# Patient Record
Sex: Female | Born: 1970
Health system: Southern US, Community
[De-identification: ages and names within clinical notes are randomized; demographics above are authoritative.]

## PROBLEM LIST (undated history)

## (undated) DIAGNOSIS — R002 Palpitations: Secondary | ICD-10-CM

## (undated) DIAGNOSIS — J4520 Mild intermittent asthma, uncomplicated: Secondary | ICD-10-CM

## (undated) DIAGNOSIS — K219 Gastro-esophageal reflux disease without esophagitis: Secondary | ICD-10-CM

## (undated) DIAGNOSIS — M47812 Spondylosis without myelopathy or radiculopathy, cervical region: Secondary | ICD-10-CM

## (undated) DIAGNOSIS — R0602 Shortness of breath: Secondary | ICD-10-CM

## (undated) DIAGNOSIS — R1013 Epigastric pain: Secondary | ICD-10-CM

## (undated) DIAGNOSIS — G8929 Other chronic pain: Secondary | ICD-10-CM

## (undated) DIAGNOSIS — M5136 Other intervertebral disc degeneration, lumbar region: Secondary | ICD-10-CM

## (undated) DIAGNOSIS — J45909 Unspecified asthma, uncomplicated: Secondary | ICD-10-CM

## (undated) DIAGNOSIS — D649 Anemia, unspecified: Secondary | ICD-10-CM

## (undated) DIAGNOSIS — I4891 Unspecified atrial fibrillation: Secondary | ICD-10-CM

## (undated) DIAGNOSIS — Z87442 Personal history of urinary calculi: Secondary | ICD-10-CM

## (undated) DIAGNOSIS — I209 Angina pectoris, unspecified: Secondary | ICD-10-CM

## (undated) DIAGNOSIS — J309 Allergic rhinitis, unspecified: Secondary | ICD-10-CM

## (undated) DIAGNOSIS — M51369 Other intervertebral disc degeneration, lumbar region without mention of lumbar back pain or lower extremity pain: Secondary | ICD-10-CM

## (undated) DIAGNOSIS — K635 Polyp of colon: Secondary | ICD-10-CM

## (undated) HISTORY — DX: Unspecified asthma, uncomplicated: J45.909

## (undated) HISTORY — DX: Anemia, unspecified: D64.9

## (undated) HISTORY — PX: OTHER SURGICAL HISTORY: SHX169

## (undated) HISTORY — DX: Mild intermittent asthma, uncomplicated: J45.20

## (undated) HISTORY — DX: Unspecified atrial fibrillation: I48.91

## (undated) HISTORY — DX: Polyp of colon: K63.5

## (undated) HISTORY — DX: Gastro-esophageal reflux disease without esophagitis: K21.9

## (undated) HISTORY — DX: Personal history of urinary calculi: Z87.442

## (undated) HISTORY — DX: Palpitations: R00.2

## (undated) HISTORY — DX: Shortness of breath: R06.02

## (undated) HISTORY — PX: DIAGNOSTIC LAPAROSCOPY: SUR761

## (undated) HISTORY — PX: KIDNEY STONE SURGERY: SHX686

## (undated) HISTORY — DX: Epigastric pain: R10.13

## (undated) HISTORY — PX: UPPER GI ENDOSCOPY: SHX6162

---

## 2004-04-17 ENCOUNTER — Ambulatory Visit: Payer: Self-pay | Admitting: Obstetrics and Gynecology

## 2004-07-24 ENCOUNTER — Inpatient Hospital Stay: Payer: Self-pay | Admitting: Obstetrics and Gynecology

## 2004-10-24 ENCOUNTER — Ambulatory Visit: Payer: Self-pay | Admitting: Internal Medicine

## 2005-01-19 ENCOUNTER — Ambulatory Visit: Payer: Self-pay | Admitting: Obstetrics and Gynecology

## 2005-02-05 HISTORY — PX: ABDOMINAL HYSTERECTOMY: SHX81

## 2005-08-21 ENCOUNTER — Ambulatory Visit: Payer: Self-pay | Admitting: Gastroenterology

## 2006-05-09 ENCOUNTER — Ambulatory Visit: Payer: Self-pay | Admitting: Obstetrics and Gynecology

## 2007-05-08 ENCOUNTER — Emergency Department: Payer: Self-pay | Admitting: Emergency Medicine

## 2007-05-08 ENCOUNTER — Other Ambulatory Visit: Payer: Self-pay

## 2007-12-08 ENCOUNTER — Emergency Department: Payer: Self-pay | Admitting: Emergency Medicine

## 2007-12-10 ENCOUNTER — Ambulatory Visit: Payer: Self-pay | Admitting: Family Medicine

## 2008-07-10 ENCOUNTER — Ambulatory Visit: Payer: Self-pay | Admitting: Internal Medicine

## 2008-08-19 ENCOUNTER — Ambulatory Visit: Payer: Self-pay | Admitting: Unknown Physician Specialty

## 2009-12-26 ENCOUNTER — Ambulatory Visit: Payer: Self-pay | Admitting: Internal Medicine

## 2010-04-30 ENCOUNTER — Emergency Department: Payer: Self-pay | Admitting: Internal Medicine

## 2010-05-19 ENCOUNTER — Ambulatory Visit: Payer: Self-pay | Admitting: Family Medicine

## 2011-08-01 ENCOUNTER — Ambulatory Visit: Payer: Self-pay | Admitting: Obstetrics and Gynecology

## 2012-02-06 DIAGNOSIS — K635 Polyp of colon: Secondary | ICD-10-CM

## 2012-02-06 HISTORY — PX: COLONOSCOPY: SHX174

## 2012-02-06 HISTORY — DX: Polyp of colon: K63.5

## 2012-02-08 ENCOUNTER — Ambulatory Visit: Payer: Self-pay | Admitting: Otolaryngology

## 2012-03-08 ENCOUNTER — Emergency Department: Payer: Self-pay | Admitting: Emergency Medicine

## 2012-03-08 LAB — CBC
HCT: 39 % (ref 35.0–47.0)
HGB: 12.8 g/dL (ref 12.0–16.0)
MCH: 29.6 pg (ref 26.0–34.0)
MCHC: 32.9 g/dL (ref 32.0–36.0)
Platelet: 231 10*3/uL (ref 150–440)
RDW: 12.8 % (ref 11.5–14.5)
WBC: 6.6 10*3/uL (ref 3.6–11.0)

## 2012-03-08 LAB — BASIC METABOLIC PANEL
BUN: 17 mg/dL (ref 7–18)
Calcium, Total: 9 mg/dL (ref 8.5–10.1)
Chloride: 107 mmol/L (ref 98–107)
EGFR (Non-African Amer.): >60
Potassium: 3.8 mmol/L (ref 3.5–5.1)
Sodium: 141 mmol/L (ref 136–145)

## 2012-03-09 LAB — RAPID INFLUENZA A&B ANTIGENS

## 2012-08-01 ENCOUNTER — Ambulatory Visit: Payer: Self-pay | Admitting: Obstetrics and Gynecology

## 2013-04-28 DIAGNOSIS — I209 Angina pectoris, unspecified: Secondary | ICD-10-CM | POA: Insufficient documentation

## 2013-08-04 ENCOUNTER — Ambulatory Visit: Payer: Self-pay | Admitting: Obstetrics and Gynecology

## 2013-08-05 ENCOUNTER — Ambulatory Visit: Payer: Self-pay | Admitting: Obstetrics and Gynecology

## 2014-02-05 DIAGNOSIS — I4891 Unspecified atrial fibrillation: Secondary | ICD-10-CM

## 2014-02-05 HISTORY — DX: Unspecified atrial fibrillation: I48.91

## 2014-02-22 ENCOUNTER — Emergency Department: Payer: Self-pay | Admitting: Emergency Medicine

## 2014-02-22 LAB — CBC
HCT: 39.5 % (ref 35.0–47.0)
HGB: 13 g/dL (ref 12.0–16.0)
MCH: 30.1 pg (ref 26.0–34.0)
MCHC: 33 g/dL (ref 32.0–36.0)
MCV: 91 fL (ref 80–100)
Platelet: 308 10*3/uL (ref 150–440)
RBC: 4.34 10*6/uL (ref 3.80–5.20)
RDW: 12.7 % (ref 11.5–14.5)
WBC: 8.6 10*3/uL (ref 3.6–11.0)

## 2014-02-22 LAB — BASIC METABOLIC PANEL
Anion Gap: 7 (ref 7–16)
BUN: 17 mg/dL (ref 7–18)
CALCIUM: 8.4 mg/dL — AB (ref 8.5–10.1)
CHLORIDE: 108 mmol/L — AB (ref 98–107)
CO2: 27 mmol/L (ref 21–32)
CREATININE: 0.71 mg/dL (ref 0.60–1.30)
EGFR (Non-African Amer.): >60
Glucose: 113 mg/dL — ABNORMAL HIGH (ref 65–99)
Osmolality: 285 (ref 275–301)
POTASSIUM: 3.7 mmol/L (ref 3.5–5.1)
Sodium: 142 mmol/L (ref 136–145)

## 2014-02-22 LAB — TROPONIN I: Troponin-I: <0.02 ng/mL

## 2014-02-23 LAB — D-DIMER(ARMC): D-Dimer: 274 ng/ml

## 2014-02-23 LAB — TROPONIN I: Troponin-I: <0.02 ng/mL

## 2014-02-24 ENCOUNTER — Inpatient Hospital Stay: Payer: Self-pay | Admitting: Internal Medicine

## 2014-02-24 LAB — CBC
HCT: 42.8 % (ref 35.0–47.0)
HGB: 13.6 g/dL (ref 12.0–16.0)
MCH: 29.1 pg (ref 26.0–34.0)
MCHC: 31.8 g/dL — AB (ref 32.0–36.0)
MCV: 92 fL (ref 80–100)
Platelet: 315 10*3/uL (ref 150–440)
RBC: 4.67 10*6/uL (ref 3.80–5.20)
RDW: 12.5 % (ref 11.5–14.5)
WBC: 13.9 10*3/uL — ABNORMAL HIGH (ref 3.6–11.0)

## 2014-02-24 LAB — BASIC METABOLIC PANEL
ANION GAP: 9 (ref 7–16)
BUN: 19 mg/dL — ABNORMAL HIGH (ref 7–18)
CHLORIDE: 105 mmol/L (ref 98–107)
CREATININE: 0.77 mg/dL (ref 0.60–1.30)
Calcium, Total: 8.9 mg/dL (ref 8.5–10.1)
Co2: 26 mmol/L (ref 21–32)
EGFR (African American): >60
EGFR (Non-African Amer.): >60
Glucose: 134 mg/dL — ABNORMAL HIGH (ref 65–99)
OSMOLALITY: 284 (ref 275–301)
POTASSIUM: 3.8 mmol/L (ref 3.5–5.1)
Sodium: 140 mmol/L (ref 136–145)

## 2014-02-24 LAB — URINALYSIS, COMPLETE
BACTERIA: NONE SEEN
BLOOD: NEGATIVE
Bilirubin,UR: NEGATIVE
GLUCOSE, UR: NEGATIVE mg/dL (ref 0–75)
Ketone: NEGATIVE
Leukocyte Esterase: NEGATIVE
Nitrite: NEGATIVE
Ph: 7 (ref 4.5–8.0)
Protein: NEGATIVE
RBC,UR: NONE SEEN /HPF (ref 0–5)
SPECIFIC GRAVITY: 1.002 (ref 1.003–1.030)
Squamous Epithelial: 1
WBC UR: NONE SEEN /HPF (ref 0–5)

## 2014-02-24 LAB — MAGNESIUM: Magnesium: 2 mg/dL

## 2014-02-24 LAB — TROPONIN I: Troponin-I: <0.02 ng/mL

## 2014-02-24 LAB — TSH: THYROID STIMULATING HORM: 4.59 u[IU]/mL — AB

## 2014-02-25 ENCOUNTER — Other Ambulatory Visit: Payer: Self-pay | Admitting: Physician Assistant

## 2014-02-25 ENCOUNTER — Telehealth: Payer: Self-pay

## 2014-02-25 DIAGNOSIS — I4891 Unspecified atrial fibrillation: Secondary | ICD-10-CM

## 2014-02-25 DIAGNOSIS — E876 Hypokalemia: Secondary | ICD-10-CM

## 2014-02-25 LAB — TROPONIN I: Troponin-I: <0.02 ng/mL

## 2014-02-25 LAB — CBC WITH DIFFERENTIAL/PLATELET
BASOS PCT: 1.3 %
Basophil #: 0.1 10*3/uL (ref 0.0–0.1)
EOS ABS: 0.1 10*3/uL (ref 0.0–0.7)
EOS PCT: 0.8 %
HCT: 39 % (ref 35.0–47.0)
HGB: 12.7 g/dL (ref 12.0–16.0)
LYMPHS PCT: 17.8 %
Lymphocyte #: 1.7 10*3/uL (ref 1.0–3.6)
MCH: 29.9 pg (ref 26.0–34.0)
MCHC: 32.7 g/dL (ref 32.0–36.0)
MCV: 92 fL (ref 80–100)
Monocyte #: 0.9 x10 3/mm (ref 0.2–0.9)
Monocyte %: 9.7 %
Neutrophil #: 6.6 10*3/uL — ABNORMAL HIGH (ref 1.4–6.5)
Neutrophil %: 70.4 %
Platelet: 289 10*3/uL (ref 150–440)
RBC: 4.25 10*6/uL (ref 3.80–5.20)
RDW: 12.5 % (ref 11.5–14.5)
WBC: 9.4 10*3/uL (ref 3.6–11.0)

## 2014-02-25 LAB — LIPID PANEL
Cholesterol: 185 mg/dL (ref 0–200)
HDL: 62 mg/dL — AB (ref 40–60)
LDL CHOLESTEROL, CALC: 111 mg/dL — AB (ref 0–100)
Triglycerides: 60 mg/dL (ref 0–200)
VLDL CHOLESTEROL, CALC: 12 mg/dL (ref 5–40)

## 2014-02-25 LAB — MAGNESIUM: Magnesium: 2.1 mg/dL

## 2014-02-25 LAB — T4, FREE: FREE THYROXINE: 1.15 ng/dL (ref 0.76–1.46)

## 2014-02-25 NOTE — Telephone Encounter (Signed)
Attempted to contact pt regarding discharge from [facility] on 02/25/14. Left message asking pt to call back w/ any questions or concerns about meds, discharge instructions or if she will be unable to keep appt w/ Dr. Mariah MillingGollan 03/08/14 @ 7:45.

## 2014-02-26 ENCOUNTER — Telehealth: Payer: Self-pay | Admitting: Internal Medicine

## 2014-02-26 NOTE — Telephone Encounter (Signed)
Patient called complaining of similar symptoms that she had when she was admitted to [facility] with A. fib. She reported left-sided chest discomfort, nausea, lightheadedness, not feeling well overall. Reported that her heart rate is in high 80s according to her estimations. I Recommended her to go to the nearest emergency department for further evaluation

## 2014-03-01 NOTE — Telephone Encounter (Signed)
Would call to see if we can help Could do EKG in the office/nurse visit,  even office visit if he is available

## 2014-03-02 NOTE — Telephone Encounter (Signed)
Spoke w/ pt.  She reports that she has been feeling fine, other than some GI issues, but no further cardiac concerns.  Moved pt's appt sooner to 03/04/14 @ 7:45. She prefers this time, as she can come in after dropping her kids off at school.

## 2014-03-04 ENCOUNTER — Encounter: Payer: Self-pay | Admitting: Cardiovascular Disease

## 2014-03-04 ENCOUNTER — Ambulatory Visit (INDEPENDENT_AMBULATORY_CARE_PROVIDER_SITE_OTHER): Payer: BLUE CROSS/BLUE SHIELD | Admitting: Cardiovascular Disease

## 2014-03-04 VITALS — BP 108/62 | HR 80 | Ht 67.0 in | Wt 152.2 lb

## 2014-03-04 DIAGNOSIS — J4520 Mild intermittent asthma, uncomplicated: Secondary | ICD-10-CM

## 2014-03-04 DIAGNOSIS — R002 Palpitations: Secondary | ICD-10-CM

## 2014-03-04 DIAGNOSIS — I4891 Unspecified atrial fibrillation: Secondary | ICD-10-CM

## 2014-03-04 DIAGNOSIS — R0602 Shortness of breath: Secondary | ICD-10-CM

## 2014-03-04 DIAGNOSIS — R1013 Epigastric pain: Secondary | ICD-10-CM

## 2014-03-04 HISTORY — DX: Mild intermittent asthma, uncomplicated: J45.20

## 2014-03-04 HISTORY — DX: Shortness of breath: R06.02

## 2014-03-04 NOTE — Patient Instructions (Signed)
You are doing well. No medication changes were made.  Consider nexium for acid reflux Track your rhythm using your phone, blood pressure cuff  Call if the shortness of breath gets worse  Please call us if you have new issues that need to be addressed before your next appt.  Your physician wants you to follow-up in: 6 months.  You will receive a reminder letter in the mail two months in advance. If you don't receive a letter, please call our office to schedule the follow-up appointment.

## 2014-03-04 NOTE — Assessment & Plan Note (Signed)
Rare palpitations. Likely unrelated to recent episode of atrial fibrillation. We have suggested if symptoms get worse, that she call our office for a 2 day monitor or 30 day monitor depending on the frequency. She can take diltiazem for any concern of arrhythmia

## 2014-03-04 NOTE — Assessment & Plan Note (Signed)
Etiology of her abdominal pain is unclear. Center on the left upper flank. Unable to exclude musculoskeletal she has EGD pending

## 2014-03-04 NOTE — Assessment & Plan Note (Signed)
Etiology of her shortness of breath is unclear. No clear signs of heart failure on today's visit. No recent arrhythmia. Symptoms of shortness of breath started weeks ago prior to any atrial fibrillation. Low likelihood of ischemia given her age and no risk factors. Suspect this could be from asthma. If symptoms get worse, suggested she call our office.

## 2014-03-04 NOTE — Assessment & Plan Note (Signed)
Suggested she continue to take her inhaler as needed for asthma

## 2014-03-04 NOTE — Assessment & Plan Note (Signed)
Maintaining normal sinus rhythm. Suggested she take diltiazem when necessary. No limitations on her exercise. Low risk for upcoming EGD

## 2014-03-04 NOTE — Progress Notes (Signed)
Patient ID: Kayla Castro, female    DOB: 08/07/1970, 44 y.o.   MRN: [id number]  HPI Comments: Kayla Castro  Is a pleasant 44 year old woman with history of asthma, anemia, [facility] to [facility]14 with abdominal pain, diarrhea, EKG showing atrial fibrillation with heart rate 160 bpm, also with self-reported shortness of breath for several weeks prior to admission, potassium 3.7 on arrival, negative cardiac enzymes, essentially normal TSH, echocardiogram showing normal ejection fraction, normal right ventricular systolic pressure to converted to normal sinus rhythm with heart rates in the 70s, discharged home with follow-up today.  On today's visit, she reports feeling well overall, occasional palpitations, continued very mild shortness of breath with exertion. She has left upper epigastric discomfort sometimes worse after eating. She is scheduled for EGD. She is concerned EGD might cause atrial fibrillation. She is nervous about working out given her recent arrhythmia. She does report a long history of periodic IBS/colitis symptoms. She's never had atrial fibrillation with previous episodes She is given diltiazem 30 g tablets to take when necessary. She has not had to take any at this time She does report having asthma and takes inhalers periodically EKG on today's visit shows normal sinus rhythm, rate 80 bpm  EKG the hospital showed atrial fibrillation with ventricular rate 164 bpm Echocardiogram reviewed with her 02/25/2014 showing normal ejection fraction, essentially normal study Total cholesterol 185, LDL 111 Prior stress test November 2009 showing no ischemia       Allergies  Allergen Reactions  . Sulfa Antibiotics Other (See Comments)    Flu like symptoms    Outpatient Encounter Prescriptions as of 03/04/2014  Medication Sig  . albuterol (PROVENTIL HFA;VENTOLIN HFA) 108 (90 BASE) MCG/ACT inhaler Inhale 2 puffs into the lungs every 6 (six) hours as needed.   . ASPIRIN LOW DOSE 81  MG EC tablet Take 81 mg by mouth daily.   Marland Kitchen. diltiazem (CARDIZEM) 30 MG tablet Take 30 mg by mouth every 6 (six) hours as needed.   . pantoprazole (PROTONIX) 40 MG tablet Take 40 mg by mouth 2 (two) times daily.   Marland Kitchen. zolpidem (AMBIEN) 5 MG tablet Take 5 mg by mouth at bedtime as needed.     Past Medical History  Diagnosis Date  . Asthma   . Anemia   . History of kidney stones   . Palpitations   . New onset a-fib     Past Surgical History  Procedure Laterality Date  . Abdominal hysterectomy    . Cesarean section      x 2    Social History  reports that she has never smoked. She does not have any smokeless tobacco history on file. She reports that she does not drink alcohol or use illicit drugs.  Family History family history includes Heart attack (age of onset: 3750) in her father; Heart disease in her father; Hypertension in her mother.      Review of Systems  Constitutional: Negative.   Respiratory: Negative.   Cardiovascular: Negative.   Gastrointestinal: Negative.   Endocrine: Negative.   Musculoskeletal: Negative.   Skin: Negative.   Neurological: Negative.   Hematological: Negative.   Psychiatric/Behavioral: Negative.   All other systems reviewed and are negative.   BP 108/62 mmHg  Pulse 80  Ht 5\' 7"  (1.702 m)  Wt 152 lb 4 oz (69.06 kg)  BMI 23.84 kg/m2  Physical Exam  Constitutional: She is oriented to person, place, and time. She appears well-developed and well-nourished.  HENT:  Head: Normocephalic.  Nose: Nose normal.  Mouth/Throat: Oropharynx is clear and moist.  Eyes: Conjunctivae are normal. Pupils are equal, round, and reactive to light.  Neck: Normal range of motion. Neck supple. No JVD present.  Cardiovascular: Normal rate, regular rhythm, S1 normal, S2 normal, normal heart sounds and intact distal pulses.  Exam reveals no gallop and no friction rub.   No murmur heard. Pulmonary/Chest: Effort normal and breath sounds normal. No respiratory  distress. She has no wheezes. She has no rales. She exhibits no tenderness.  Abdominal: Soft. Bowel sounds are normal. She exhibits no distension. There is no tenderness.  Musculoskeletal: Normal range of motion. She exhibits no edema or tenderness.  Lymphadenopathy:    She has no cervical adenopathy.  Neurological: She is alert and oriented to person, place, and time. Coordination normal.  Skin: Skin is warm and dry. No rash noted. No erythema.  Psychiatric: She has a normal mood and affect. Her behavior is normal. Judgment and thought content normal.    Assessment and Plan  Nursing note and vitals reviewed.

## 2014-03-08 ENCOUNTER — Encounter: Payer: Self-pay | Admitting: Cardiovascular Disease

## 2014-03-10 ENCOUNTER — Telehealth: Payer: Self-pay | Admitting: Cardiovascular Disease

## 2014-03-10 DIAGNOSIS — R002 Palpitations: Secondary | ICD-10-CM

## 2014-03-10 NOTE — Telephone Encounter (Signed)
Spoke w/ pt.  Per her discussion via MyChart, she would like to proceed w/ 30 day event monitor.   Advised her that [employer] should contact her tonight or tomorrow.

## 2014-03-10 NOTE — Telephone Encounter (Signed)
Had msg on mychart about getting a heart monitor.  Please call patient.

## 2014-03-13 DIAGNOSIS — I4891 Unspecified atrial fibrillation: Secondary | ICD-10-CM | POA: Diagnosis not present

## 2014-03-13 DIAGNOSIS — R002 Palpitations: Secondary | ICD-10-CM | POA: Diagnosis not present

## 2014-03-24 ENCOUNTER — Telehealth: Payer: Self-pay

## 2014-03-24 NOTE — Telephone Encounter (Signed)
Pt called, states she got lightheaded, chest tightness, and shaking. States a "cold"feeling was going down her arm.

## 2014-03-24 NOTE — Telephone Encounter (Signed)
Spoke w/ pt.  She reports that she is wearing a 30 day monitor, pushed the button, as her BP was 148/89.  Advised her that if her HR were abnormal, she will receive a call from [employer] and they will upload report for Dr. Windell HummingbirdGollan's review.  Asked her to call back if sx continue.

## 2014-03-26 ENCOUNTER — Emergency Department: Payer: Self-pay | Admitting: Emergency Medicine

## 2014-03-30 ENCOUNTER — Telehealth: Payer: Self-pay | Admitting: *Deleted

## 2014-03-30 NOTE — Telephone Encounter (Signed)
Patient is wearing a 30 day monitor for a couple of weeks now and wants to d/c it due to seeing a gi doctor. She is feeling much better after her gi appt.  Can you please call her regarding d/c her monitor. Thanks

## 2014-04-01 NOTE — Telephone Encounter (Signed)
Spoke w/ pt.  She states that since her GI issue has resolved, she has not had any other sx and would like to take the monitor off.

## 2014-04-01 NOTE — Telephone Encounter (Signed)
Okay to discontinue if she would like. Hope We have enough information to help her

## 2014-04-06 ENCOUNTER — Ambulatory Visit: Payer: Self-pay | Admitting: Gastroenterology

## 2014-04-13 ENCOUNTER — Ambulatory Visit: Payer: Self-pay | Admitting: Gastroenterology

## 2014-04-26 ENCOUNTER — Telehealth: Payer: Self-pay

## 2014-04-26 NOTE — Telephone Encounter (Signed)
Reviewed holter results w/ pt: "NSR w/ rare APCs" She verbalizes understanding, though she does report that she was recently diagnosed w/ gallbladder issues and is having these addressed by her PCP.  Asked her to call back if we can be of further assistance.

## 2014-04-27 ENCOUNTER — Ambulatory Visit (INDEPENDENT_AMBULATORY_CARE_PROVIDER_SITE_OTHER): Payer: BLUE CROSS/BLUE SHIELD

## 2014-04-27 ENCOUNTER — Other Ambulatory Visit: Payer: Self-pay

## 2014-04-27 DIAGNOSIS — I4891 Unspecified atrial fibrillation: Secondary | ICD-10-CM

## 2014-04-27 DIAGNOSIS — R002 Palpitations: Secondary | ICD-10-CM

## 2014-05-04 ENCOUNTER — Encounter: Payer: Self-pay | Admitting: General Surgery

## 2014-05-13 ENCOUNTER — Ambulatory Visit (INDEPENDENT_AMBULATORY_CARE_PROVIDER_SITE_OTHER): Payer: BLUE CROSS/BLUE SHIELD | Admitting: General Surgery

## 2014-05-13 ENCOUNTER — Telehealth: Payer: Self-pay | Admitting: Cardiovascular Disease

## 2014-05-13 ENCOUNTER — Encounter: Payer: Self-pay | Admitting: General Surgery

## 2014-05-13 VITALS — BP 120/68 | HR 74 | Resp 12 | Ht 67.0 in | Wt 150.0 lb

## 2014-05-13 DIAGNOSIS — R1013 Epigastric pain: Secondary | ICD-10-CM

## 2014-05-13 NOTE — Telephone Encounter (Signed)
Patient says she may need to be seen as her gi doctor told her she is "skipping beats"  But she was not symptomatic.  On exertion she has pain /tightness in center of chest if she is out of breath   Patient wants to be seen.  Patient feels chest tightness.

## 2014-05-13 NOTE — Patient Instructions (Signed)
The patient is aware to call back for any questions or concerns.  

## 2014-05-13 NOTE — Telephone Encounter (Signed)
Spoke w/ pt.   She reports there here HR was irregular while in Dr. Rutherford NailByrnett's office.  She reports that she is having significant GI issues and is very limited in what she can eat w/o developing chest pressure.  Reports that she develops chest pressure w/ the slightest bit of exertion.  Pt sched to see Dr. Mariah MillingGollan tomorrow am at 8:15.

## 2014-05-13 NOTE — Progress Notes (Signed)
Patient ID: Kayla Castro, female   DOB: November 16, 1970, 44 y.o.   MRN: [id number]  Chief Complaint  Patient presents with  . Abdominal Pain    HPI Kayla Castro is a 44 y.o. female.  Here today for evaluation of upper epigastric abdominal pain. Described as an "knot" and "heavy brick" full feeling in the mid epigastric region occurring about 1 hour later. She states she notices it when she eats. Certain foods trigger the pain, spicy and greasy are worse. The pain started in January around the same time she started having a fib which only lasted one day. Two months prior to the pain she was having bad reflux symptoms.  CT scan 04-06-14 and HIDA scan was 04-13-14. Diarrhea is chronic but seems to be daily over the past 1 week. She has noticed vomiting with pizza. 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   HPI  Past Medical History  Diagnosis Date  . Anemia   . History of kidney stones   . Palpitations   . New onset a-fib 2016    Dr. Mariah Milling  . GERD (gastroesophageal reflux disease)   . Colon polyp 2014  . Asthma     Dr. Mayo Ao    Past Surgical History  Procedure Laterality Date  . Cesarean section      x 2  . Abdominal hysterectomy  2007  . Scar tissue excision x6  2004, 2006    x6  . Colonoscopy  2014    Dr Bluford Kaufmann  . Upper gi endoscopy  2014, 2016    Dr. Bluford Kaufmann    Family History  Problem Relation Age of Onset  . Hypertension Mother   . Heart attack Father 34  . Heart disease Father   . CVA Mother     Social History History  Substance Use Topics  . Smoking status: Never Smoker   . Smokeless tobacco: Not on file  . Alcohol Use: No    Allergies  Allergen Reactions  . Sulfa Antibiotics Other (See Comments)    Flu like symptoms    Current Outpatient Prescriptions  Medication Sig Dispense Refill  . albuterol (PROVENTIL HFA;VENTOLIN HFA) 108 (90 BASE) MCG/ACT inhaler Inhale 2 puffs into the lungs every 6 (six) hours as needed.     . Multiple Vitamin  (MULTIVITAMIN) capsule Take 1 capsule by mouth daily.    Marland Kitchen diltiazem (CARDIZEM) 30 MG tablet Take 30 mg by mouth 3 (three) times daily as needed.     No current facility-administered medications for this visit.    Review of Systems Review of Systems  Constitutional: Negative.   Respiratory: Negative.   Cardiovascular: Negative.   Gastrointestinal: Positive for nausea, vomiting, abdominal pain and diarrhea.    Blood pressure 120/68, pulse 74, resp. rate 12, height  (1.702 m), weight 150 lb (68.04 kg), last menstrual period 02/05/2005.  Physical Exam Physical Exam  Constitutional: She is oriented to person, place, and time. She appears well-developed and well-nourished.  Neck: Neck supple.  Cardiovascular: Normal rate, regular rhythm and normal heart sounds.   Pulmonary/Chest: Effort normal and breath sounds normal.  Abdominal: Soft. Normal appearance and bowel sounds are normal. There is tenderness in the epigastric area.  Lymphadenopathy:    She has no cervical adenopathy.  Neurological: She is alert and oriented to person, place, and time.  Skin: Skin is warm and dry.    Data Reviewed GI note of 04/19/2014. Reported symptoms with HIDA scan, this  was not confirmed on patient interview today.  Upper endoscopy completed March 2016 was unremarkable.  Laboratory studies dated 05/24/2014 showed a normal CBC, normal CA 19-9. HIDA scan dated 04/13/2014 showed normal uptake of the radioactive tracer. Ejection fraction at 45 minutes was 91%. The patient denied pain with the CCK injection.  CT scan of the abdomen dated 04/06/2014 was unremarkable. These films were reviewed.  Assessment    Atypical abdominal pain without clear relationship to the biliary tract.    Plan    I spoke with Lutricia FeilPaul Oh, M.D. after the patient's visit and reviewed with him the patient's reported no symptoms after CCK injection. At this time there does not appear to be indication for cholecystectomy. He  will arrange follow-up with the patient for further testing.    Discussed risk and benefits of cholecystectomy. Will discuss with Dr. Bluford Kaufmannh.   PCP:  Elmer RampVirk, Charanjit REf Dr. Waylan Rocherh    Jmari Pelc, Merrily PewJeffrey W 05/14/2014, 10:04 PM

## 2014-05-14 ENCOUNTER — Encounter: Payer: Self-pay | Admitting: Cardiovascular Disease

## 2014-05-14 ENCOUNTER — Ambulatory Visit (INDEPENDENT_AMBULATORY_CARE_PROVIDER_SITE_OTHER): Payer: BLUE CROSS/BLUE SHIELD | Admitting: Cardiovascular Disease

## 2014-05-14 VITALS — BP 100/76 | HR 84 | Ht 61.0 in | Wt 150.5 lb

## 2014-05-14 DIAGNOSIS — R0602 Shortness of breath: Secondary | ICD-10-CM | POA: Diagnosis not present

## 2014-05-14 DIAGNOSIS — R002 Palpitations: Secondary | ICD-10-CM

## 2014-05-14 DIAGNOSIS — J4520 Mild intermittent asthma, uncomplicated: Secondary | ICD-10-CM | POA: Diagnosis not present

## 2014-05-14 DIAGNOSIS — R1013 Epigastric pain: Secondary | ICD-10-CM

## 2014-05-14 DIAGNOSIS — I4891 Unspecified atrial fibrillation: Secondary | ICD-10-CM

## 2014-05-14 HISTORY — DX: Epigastric pain: R10.13

## 2014-05-14 NOTE — Assessment & Plan Note (Signed)
Maintaining normal sinus rhythm on today. Symptoms less likely from arrhythmia. Recommended she monitor her pulse, diltiazem for arrhythmia as needed. Unable to do this on a regular basis given her low blood pressure

## 2014-05-14 NOTE — Assessment & Plan Note (Signed)
Etiology of her shortness of breath symptoms is unclear. We have suggested she have a routine treadmill study to rule out arrhythmia with exertion. Less likely ischemia.

## 2014-05-14 NOTE — Patient Instructions (Signed)
You are doing well. No medication changes were made.  We will schedule a treadmill study for shortness of breath, hx of atrial fibrillation  Please call us if you have new issues that need to be addressed before your next appt.  Your physician wants you to follow-up in: 6 months.  You will receive a reminder letter in the mail two months in advance. If you don't receive a letter, please call our office to schedule the follow-up appointment.

## 2014-05-14 NOTE — Progress Notes (Signed)
Patient ID: Kayla Castro, female    DOB: 08/21/1970, 44 y.o.   MRN: [id number]  HPI Comments: Kayla Castro  Is a pleasant 44 year old woman with history of asthma, anemia, presenting to [facility]25/14 with abdominal pain, diarrhea, EKG showing atrial fibrillation with heart rate 160 bpm, also with self-reported shortness of breath for several weeks prior to admission, potassium 3.7 on arrival, negative cardiac enzymes, essentially normal TSH, echocardiogram showing normal ejection fraction, normal right ventricular systolic pressure,   converting to normal sinus rhythm with heart rates in the 70s, discharged home with follow-up today.  On her prior clinic visit, she had occasional palpitations, very mild shortness of breath with exertion, left upper epigastric discomfort sometimes worse after eating. She was scheduled for EGD. She does report a long history of periodic IBS/colitis symptoms.  She is given diltiazem 30 g tablets to take when necessary.  She does report having asthma and takes inhalers periodically  In follow-up today, she reports having worsening abdominal pain after eating  fatty foods, heavy meals as she has pain after eating in her xiphoid area She has seen GI, as well as Dr. Doristine Castro She is concerned it might be her gallbladder. She reports that taking Tums and PPI medications makes her symptoms worse. Also reports having shortness of breath with heavy exertion. Denies any significant tachycardia. Rare palpitations Xiphoid area is tender to touch. She is unclear if the shortness of breath is from her asthma. She has not taken diltiazem before exertion, concerned as her blood pressures running low  EKG on today's visit shows normal sinus rhythm with rate 84 bpm, no significant ST or T-wave changes  EKG from the hospital showed atrial fibrillation with ventricular rate 164 bpm Echocardiogram reviewed with her 02/25/2014 showing normal ejection fraction, essentially normal  study Total cholesterol 185, LDL 111 Prior stress test November 2009 showing no ischemia       Allergies  Allergen Reactions  . Sulfa Antibiotics Other (See Comments)    Flu like symptoms    Outpatient Encounter Prescriptions as of 05/14/2014  Medication Sig  . albuterol (PROVENTIL HFA;VENTOLIN HFA) 108 (90 BASE) MCG/ACT inhaler Inhale 2 puffs into the lungs every 6 (six) hours as needed.   . diltiazem (CARDIZEM) 30 MG tablet Take 30 mg by mouth 3 (three) times daily as needed.  . Multiple Vitamin (MULTIVITAMIN) capsule Take 1 capsule by mouth daily.  . [DISCONTINUED] zolpidem (AMBIEN) 5 MG tablet Take 5 mg by mouth at bedtime as needed.     Past Medical History  Diagnosis Date  . Anemia   . History of kidney stones   . Palpitations   . New onset a-fib 2016    Dr. Mariah MillingGollan  . GERD (gastroesophageal reflux disease)   . Colon polyp 2014  . Asthma     Dr. Mayo AoFlemming    Past Surgical History  Procedure Laterality Date  . Cesarean section      x 2  . Abdominal hysterectomy  2007  . Scar tissue excision x6  2004, 2006    x6  . Colonoscopy  2014    Dr Bluford Kaufmannh  . Upper gi endoscopy  2014, 2016    Dr. Bluford Kaufmannh    Social History  reports that she has never smoked. She does not have any smokeless tobacco history on file. She reports that she does not drink alcohol or use illicit drugs.  Family History family history includes CVA in her mother; Heart attack (age of onset: 650) in  her father; Heart disease in her father; Hypertension in her mother.   Review of Systems  Constitutional: Negative.   Respiratory: Positive for shortness of breath.   Cardiovascular: Negative.   Gastrointestinal: Positive for nausea and abdominal pain.  Endocrine: Negative.   Musculoskeletal: Negative.   Skin: Negative.   Neurological: Negative.   Hematological: Negative.   Psychiatric/Behavioral: Negative.   All other systems reviewed and are negative.   BP 100/76 mmHg  Pulse 84  Ht  (1.549 m)   Wt 150 lb 8 oz (68.266 kg)  BMI 28.45 kg/m2  LMP 02/05/2005  Physical Exam  Constitutional: She is oriented to person, place, and time. She appears well-developed and well-nourished.  HENT:  Head: Normocephalic.  Nose: Nose normal.  Mouth/Throat: Oropharynx is clear and moist.  Eyes: Conjunctivae are normal. Pupils are equal, round, and reactive to light.  Neck: Normal range of motion. Neck supple. No JVD present.  Cardiovascular: Normal rate, regular rhythm, S1 normal, S2 normal, normal heart sounds and intact distal pulses.  Exam reveals no gallop and no friction rub.   No murmur heard. Pulmonary/Chest: Effort normal and breath sounds normal. No respiratory distress. She has no wheezes. She has no rales. She exhibits no tenderness.  Abdominal: Soft. Bowel sounds are normal. She exhibits no distension. There is no tenderness.  Musculoskeletal: Normal range of motion. She exhibits no edema or tenderness.  Lymphadenopathy:    She has no cervical adenopathy.  Neurological: She is alert and oriented to person, place, and time. Coordination normal.  Skin: Skin is warm and dry. No rash noted. No erythema.  Psychiatric: She has a normal mood and affect. Her behavior is normal. Judgment and thought content normal.    Assessment and Plan  Nursing note and vitals reviewed.

## 2014-05-14 NOTE — Assessment & Plan Note (Signed)
Unclear if symptoms of shortness of breath with heavy exertion are from asthma. Will order routine treadmill study

## 2014-05-14 NOTE — Assessment & Plan Note (Signed)
She has follow-up with GI and general surgery. Symptoms unclear though. Associated with eating. Limiting her diet to fish and bland food Currently not on PPI or Tums. Recommended she try Zantac

## 2014-05-20 ENCOUNTER — Encounter: Payer: Self-pay | Admitting: Cardiovascular Disease

## 2014-05-20 ENCOUNTER — Ambulatory Visit (INDEPENDENT_AMBULATORY_CARE_PROVIDER_SITE_OTHER): Payer: BLUE CROSS/BLUE SHIELD | Admitting: Cardiovascular Disease

## 2014-05-20 DIAGNOSIS — R002 Palpitations: Secondary | ICD-10-CM

## 2014-05-20 NOTE — Procedures (Signed)
Exercise Treadmill Test  Treadmill ordered for recent epsiodes of chest pain.  Resting EKG shows NSR with rate of 80 bpm, no ST or T-wave changes concerning for ischemia Resting blood pressure of 127/81 Stand bruce protocal was used.  Patient exercised for 9 min  Peak heart rate of 153 bpm.  This was 86% of the maximum predicted heart rate (177 bpm). Achieved 10.1 METS No symptoms of chest pain or lightheadedness were reported at peak stress or in recovery.  Peak Blood pressure recorded was 165/73 Heart rate at 3 minutes in recovery was 92 bpm No ST changes concerning for ischemia at peak stress or in recovery  FINAL IMPRESSION: Normal exercise stress test. No significant EKG changes concerning for ischemia. Excellent exercise tolerance.

## 2014-06-06 NOTE — H&P (Signed)
PATIENT NAME:  Kayla Castro, Kayla Castro MR#:  [id number] DATE OF BIRTH:  12-08-1970  DATE OF ADMISSION:  02/24/2014  REFERRING PHYSICIAN:  Charlestine Night. Scotty Court, MD  PRIMARY CARE PHYSICIAN:  Neomia Dear. Harrington Challenger, MD   ADMITTING PHYSICIAN:  Crissie Figures, MD   CHIEF COMPLAINT:  Palpitations.   HISTORY OF PRESENT ILLNESS:  A 44 year old Caucasian female with past medical history significant for infrequent asthma and history of gastroesophageal reflux disease, who presents to the Emergency Room with the complaints of palpitations, which started after supper last night. The patient states that she had supper and following which she felt some uneasiness in the stomach and following which she had an episode of bowel movement and she developed palpitations with the sensation of pounding of the heart, hence came to the Emergency Room for further evaluation. The patient denies any chest pain, but she does have some lightheadedness, but no loss of consciousness. She gives a history of similar episodes, which she has been experiencing in the past 2 to 3 days and for which she came to the Emergency Room 2 days ago and was evaluated by the ED physician and was found to be in normal sinus rhythm and sent home. No history of any fever, cough, or shortness of breath. No nausea, vomiting, diarrhea, or abdominal pain. No urinary symptoms. In the Emergency Room, the patient was evaluated by the ED physician and was found to be in atrial fibrillation with rapid ventricular rate of 164 per minute. The patient was given IV Cardizem 20 mg IV push x 2 doses and was also given Ativan for nausea and following which her heart rate slightly improved but did not normalize. The patient was subsequently started on IV Cardizem drip in the Emergency Room, and currently she is comfortably resting in the bed. Denies any complaints such as palpitations, chest pain, shortness of breath, nausea, or vomiting at this time. No prior symptoms in the past.    PAST MEDICAL HISTORY: 1.  Bronchial asthma.  2.  Gastroesophageal reflux disease.   PAST SURGICAL HISTORY: 1.  C-section x 2.  2.  Hysterectomy.   ALLERGIES:  SULFA.   FAMILY HISTORY:  Father with coronary artery disease and MI. Mother with hypertension and stroke.   SOCIAL HISTORY:  She is married and has 2 children. She is self-employed and has a International aid/development worker. Denies any history of smoking, alcohol or substance abuse.    HOME MEDICATIONS:   1.  Omeprazole 20 mg tablet 1 tablet orally once a day.  2.  ProAir HFA 90 mcg inhalation aerosol as needed 4 times a day for shortness of breath.   REVIEW OF SYSTEMS: CONSTITUTIONAL:  Negative for fever, chills, fatigue, or generalized weakness.  EYES:  Negative for blurred vision or double vision. No pain. No redness. No discharge.  EARS, NOSE, AND THROAT:  Negative for tinnitus, ear pain, hearing loss, epistaxis, nasal discharge, or difficulty swallowing.  RESPIRATORY:  Negative for cough, wheezing, dyspnea, hemoptysis, or painful respiration.  CARDIOVASCULAR:  Positive for palpitations as noted in the history of present illness, and she does have mild lightheadedness, but no syncopal episodes. No dyspnea on exertion. No pedal edema. No chest pain.  GASTROINTESTINAL:  Positive for mild nausea, but no vomiting or diarrhea. No hematemesis. No melena. History of gastroesophageal reflux disease for which she takes medications and under control.  GENITOURINARY:  Negative for dysuria, frequency, urgency, or hematuria.  ENDOCRINE:  Negative for polyuria, nocturia, or heat or cold intolerance.  HEMATOLOGIC AND LYMPHATIC:  Negative for anemia, easy bruising or bleeding, or swollen glands.  INTEGUMENTARY:  Negative for acne, skin rash, or lesions.  MUSCULOSKELETAL:  Negative for neck pain or back pain. No history of arthritis or gout.  NEUROLOGICAL:  Negative for focal weakness or numbness. No history of CVA, TIA, seizure disorder, or memory  loss.  PSYCHIATRIC:  Negative for anxiety, insomnia, or depression.   PHYSICAL EXAMINATION: VITAL SIGNS:  Temperature 98.8 degrees Fahrenheit, pulse rate 162 per minute, respirations 22 per minute, systolic blood pressure on arrival 150/103, oxygen saturation is 93% on room air.  GENERAL:  Well-nourished young lady, alert and oriented, in no acute distress, comfortably resting in the bed.  HEAD:  Atraumatic, normocephalic.  EYES:  Pupils are equal and react to light and accommodation. No conjunctival pallor. No scleral icterus. Extraocular movements are intact.  NOSE:  No drainage. No lesions.  EARS:  No drainage. No external lesions.  ORAL CAVITY:  No mucosal lesions. No exudates.  NECK:  Supple. No JVD. No thyromegaly. No carotid bruits. Range of motion of the neck is within normal limits.  RESPIRATORY:  Good respiratory effort. Not using accessory muscles of respiration. Bilateral vesicular breath sounds present. No rales or rhonchi.  CARDIOVASCULAR:  S1 and S2, irregularly irregular. Tachycardia present. No murmurs, gallops, or clicks. Peripheral pulses equal at carotid, femoral, and pedal pulses. No peripheral edema.  GASTROINTESTINAL:  Abdomen is soft and nontender. No hepatosplenomegaly. No masses. No rigidity. No guarding. Bowel sounds present and equal in all 4 quadrants.  GENITOURINARY:  Deferred.  MUSCULOSKELETAL:  No joint tenderness or effusion. Strength and tone are equal bilaterally. Range of motion is adequate.  SKIN:  Inspection within normal limits.  LYMPHATIC:  No cervical lymphadenopathy.  VASCULAR:  Good dorsalis pedis and posterior tibial pulses.  NEUROLOGIC:  Alert, awake, and oriented x 3. Cranial nerves II through XII are grossly intact. No sensory defect. Motor strength is 5/5 in both upper and lower extremities. DTRs are 2+ bilaterally and symmetrical. Plantars are downgoing.  PSYCHIATRIC:  Alert, awake, and oriented x 3. Judgment and insight are adequate. Memory and  mood are within normal limits.   LABORATORY DATA:  Serum glucose is 134, BUN 19, creatinine 0.77, sodium 140, potassium 3.8, chloride 105, bicarbonate 26, total calcium 8.9, magnesium 2.0. Troponin is less than 0.02. TSH is 4.59. WBC is 13.9, hemoglobin 13.6, hematocrit 42.8, platelet count 315,000. D-dimer is 274. Urinalysis is unremarkable.  IMAGING STUDIES:  Chest x-ray:  No active cardiopulmonary disease.   EKG:  Atrial fibrillation with ventricular rate of 164 beats per minute. No acute ST-T changes.   ASSESSMENT AND PLAN:  A 44 year old Caucasian female with a past medical history of bronchial asthma and gastroesophageal reflux disease, presents with palpitations, found to have atrial fibrillation with rapid ventricular rate, which is a new onset.    1.  Atrial fibrillation with rapid ventricular rate, new onset, etiology not known. D-dimer is negative. TSH is mildly high. No history of hypertension or thyroid disease in the past. The patient received IV Cardizem push x 2 in the Emergency Room and still rate is not under control, hence started on a Cardizem drip. Plan:  Admit to critical care unit. Continue Cardizem drip. Cycle cardiac enzymes. Start aspirin 81 mg 1 tablet daily. Order echocardiogram and cardiology consult and check free T4. Will not consider anticoagulation at this time because of low CHADS2 score.  2.  History of bronchial asthma, infrequent symptoms, on p.r.n.  albuterol. No symptoms at present. Monitor and consider albuterol as needed.  3.  History of gastroesophageal reflux disease, stable on proton pump inhibitor. Continue same.  4.  Elevated TSH. No history of thyroid disease. Rule out hypothyroidism. Check free T4 and follow up accordingly.  5.  Deep vein thrombosis prophylaxis with subcutaneous Lovenox.  6.  Gastrointestinal prophylaxis with proton pump inhibitor.   CODE STATUS:  Full code.   TIME SPENT:  50 minutes.    ____________________________ Crissie FiguresEdavally N.  Quinita Kostelecky, MD enr:nb D: 02/25/2014 01:06:29 ET T: 02/25/2014 01:50:09 ET JOB#: [id number]  cc: Crissie FiguresEdavally N. Samyuktha Brau, MD, <Dictator> Neomia Dearavid N. Harrington Challengerhies, MD Crissie FiguresEDAVALLY N Aariz Maish MD ELECTRONICALLY SIGNED 02/26/2014 2:09

## 2014-06-06 NOTE — Discharge Summary (Signed)
PATIENT NAME:  Kayla Castro, Kayla Castro MR#:  [id number] DATE OF BIRTH:  [birth year]  DATE OF ADMISSION:  02/24/2014 DATE OF DISCHARGE:  02/25/2014  DISCHARGE DIAGNOSES:   1.  Atrial fibrillation with rapid ventricular response, paroxysmal.  2.  Gastroesophageal reflux disease.   DISCHARGE MEDICATIONS:  1.  ProAir 2 puffs inhalation 4 times a day as needed.  2.  Omeprazole 20 mg delayed-release capsule 2 times a day.  3.  Aspirin 81 mg once a day.  4.  Cardizem 30 mg oral tablet every 6 hours only if she has  palpitations; advised to check heart rate and blood pressure at that time with her blood pressure cuff.  DIET ON DISCHARGE: Regular.   DIET CONSISTENCY: Regular.   FOLLOWUP: Advised to follow in 1-2 weeks in cardiology clinic.   HISTORY OF PRESENT ILLNESS: This is a 44 year old Caucasian female with a past history of frequent asthma and gastroesophageal reflux disease, who presented to the Emergency Room with complaints of palpitation which started after dinner the previous night. She had some uneasiness in the stomach and following with that an episode of bowel movement and developed palpitations, and sensations of a pounding heart. She came to the Emergency Room for that. She denied any chest pain, but had some lightheadedness. She has had similar episodes for the last 2-3 days on and off. She came to the Emergency Room 2 days ago and was evaluated by ER physician. She was in normal sinus rhythm without any other history, and so ER physician  discharged her home. At this time, her heart rate was found 164 per minute, and so she was given IV Cardizem 120 mg IV push, 2 times dose, and then admitted for further management of her symptoms to medical services.   HOSPITAL COURSE AND STAY: 1.  Atrial fibrillation with rapid ventricular response. Thyroid function was normal and no infection or dehydration was found. The patient converted to a normal sinus rhythm so there was no need for anticoagulation,  as CHADS score was very low. The cardiology team followed the patient, and they suggested to give her as-needed Cardizem on discharge, and follow up in the office; we advised her to do that, and she agreed.  2.  Bronchial asthma, infrequent symptoms. She was on as-needed albuterol inhaler. There were no symptoms in hospital, and she remained stable.  3.  History of gastroesophageal reflux. She was on a proton pump inhibitor; we continued that.  4.  Elevated TSH. Her T4 was normal, and she did not have any thyroid history.   CONSULTS IN THE HOSPITAL: Antonieta Ibaimothy J. Gollan, MD, cardiology.   IMPORTANT LABORATORY RESULTS:  1.  Troponin less than 0.02. D-dimer was 274. TSH was 4.59. Glucose 134, BUN 19, creatine 077, sodium 140, potassium 3.8, chloride 105, CO2 of 26, calcium 8.9.  2.  WBC 13.9, hemoglobin 13.6, platelet count is 315,000 and MCV is 92.  3.  Magnesium is 2.0.  4.  Thyroxine of 1.15.  5.  Chest x-ray, portable, showed no acute disease.  6.  Urinalysis was negative.  7.  Echocardiogram showed normal study essentially, ejection fraction 60-65% and normal ventricular systolic function, right ventricular size and function were normal.   TOTAL TIME SPENT ON THIS DISCHARGE: 40 minutes.    ____________________________ Hope PigeonVaibhavkumar G. Elisabeth PigeonVachhani, MD vgv:MT D: 03/02/2014 08:22:03 ET T: 03/02/2014 12:54:21 ET JOB#: [id number]  cc: Hope PigeonVaibhavkumar G. Elisabeth PigeonVachhani, MD, <Dictator> Antonieta Ibaimothy J. Gollan, MD Neomia Dearavid N. Harrington Challengerhies, MD Heath GoldVAIBHAVKUMAR [facility] Center[facility] MD ELECTRONICALLY SIGNED 03/10/2014  23:10 

## 2014-06-06 NOTE — Consult Note (Signed)
General Aspect Primary Cardiologist: New to [facility] ________________  44 year old female with history of asthma, anemia, and kidney stones who presented to [facility] ED on 1/18 with palpitations that developed around 10 PM that day and was found to be in NSR with a rate in the 90s, troponin negative, and was sent home. She presented to [facility] ED via EMS on 1/21 overnight with onset of palpitations that began around 9 PM and was found to be in new onset a-fib with RVR with rates in the 160s.  ________________  PMH: 1. Asthma 2. Anemia 3. Kidney stones ______________   Present Illness She has had intermittent SOB for the past couple of weeks associated with work. She also notes intermittent lower abdominal cramps that are associated with diarrhea usually once per month or so, never with a formal evaluation. No new foods. She has not been sick recently. Weight has been stable. No cough. She is quite active at baseline. She has well controlled asthma. She did recently start taking soy supplement for menopause.   She presented to [facility] ED on 1/18 with sudden onset of palpitations/malaise and SOB. Upon her arrival she was found to be in NSR with HR in the 90s. Troponin negative. CXR negative. Labs unremarkable. She was discharged home.   She again developed palpitations with increaed SOB around 9 PM on the night of 1/20 starting after severe ABD pain and diarrhea. She called EMS and was brought to [facility] ED. Upon her arrival she was found to be in new onset a-fib with RVR with rates in the 160s. She was given IV diltiazem 20 mg x 2 and Ativan for nausea without much change in her rate. She was placed on diltiazem gtt and admitted. Troponin is negative x 5. K+ 3.7-->3.8. Mg is 2.1. TSH 4.59, free T4 1.15. Echo showed EF 60-65%, normal RV size and systolic function, normal RVSP, essentially normal study. Her BP has been soft this morning in the 90s/60s without getting any medication. She did convert to NSR this morning  with rates in the 70s. She also notes some ache along the chest wall.   Physical Exam:  GEN well developed, well nourished, no acute distress   HEENT hearing intact to voice, moist oral mucosa   NECK supple   RESP normal resp effort  clear BS   CARD Regular rate and rhythm  No murmur   ABD denies tenderness  soft   LYMPH negative neck   EXTR negative edema   SKIN normal to palpation   NEURO cranial nerves intact   PSYCH alert, A+O to time, place, person, good insight   Review of Systems:  Subjective/Chief Complaint diarrhea now resolved, tachycardia   General: Fatigue   Skin: No Complaints   ENT: No Complaints   Eyes: No Complaints   Neck: No Complaints   Respiratory: Short of breath   Cardiovascular: Chest pain or discomfort  Palpitations   Gastrointestinal: Nausea  Diarrhea   Genitourinary: No Complaints   Vascular: No Complaints   Musculoskeletal: No Complaints   Neurologic: No Complaints   Hematologic: No Complaints   Endocrine: No Complaints   Psychiatric: No Complaints   Review of Systems: All other systems were reviewed and found to be negative   Medications/Allergies Reviewed Medications/Allergies reviewed   Family & Social History:  Family and Social History:  Family History Coronary Artery Disease  father: CAD s/p MI in his 55s, passed 2/2 staph infection   Social History negative tobacco,  negative ETOH, negative Illicit drugs   Place of Living Home     Asthma:    Hysterectomy - Total:    Cesarean Section:    Hysterectomy:   Home Medications: Medication Instructions Status  ProAir HFA CFC free 90 mcg/inh inhalation aerosol 2 puff(s) inhaled 4 times a day, As Needed Active  omeprazole 20 mg oral delayed release capsule 1 cap(s) orally 2 times a day Active   Lab Results:  Thyroid:  20-Jan-16 21:30   Thyroid Stimulating Hormone  4.59 (0.45-4.50 (IU = International Unit)  ----------------------- Pregnant patients have   different reference  ranges for TSH:  - - - - - - - - - -  Pregnant, first trimetser:  0.36 - 2.50 uIU/mL)    21:39   Thyroxine, Free 1.15 (Result(s) reported on 25 Feb 2014 at 12:36AM.)  Routine Chem:  20-Jan-16 21:39   Magnesium, Serum 2.0 (1.8-2.4 THERAPEUTIC RANGE: 4-7 mg/dL TOXIC: > 10 mg/dL  -----------------------)  Glucose, Serum  134  BUN  19  Creatinine (comp) 0.77  Sodium, Serum 140  Potassium, Serum 3.8  Chloride, Serum 105  CO2, Serum 26  Calcium (Total), Serum 8.9  Anion Gap 9  Osmolality (calc) 284  eGFR (African American) >60  eGFR (Non-African American) >60 (eGFR values <43mL/min/1.73 m2 may be an indication of chronic kidney disease (CKD). Calculated eGFR, using the MRDR Study equation, is useful in  patients with stable renal function. The eGFR calculation will not be reliable in acutely ill patients when serum creatinine is changing rapidly. It is not useful in patients on dialysis. The eGFR calculation may not be applicable to patients at the low and high extremes of body sizes, pregnant women, and vegetarians.)  21-Jan-16 05:39   Magnesium, Serum 2.1 (1.8-2.4 THERAPEUTIC RANGE: 4-7 mg/dL TOXIC: > 10 mg/dL  -----------------------)  Cholesterol, Serum 185  Triglycerides, Serum 60  HDL (INHOUSE)  62  Cardiac:  20-Jan-16 21:39   Troponin I < 0.02 (0.00-0.05 0.05 ng/mL or less: NEGATIVE  Repeat testing in 3-6 hrs  if clinically indicated. >0.05 ng/mL: POTENTIAL  MYOCARDIAL INJURY. Repeat  testing in 3-6 hrs if  clinically indicated. NOTE: An increase or decrease  of 30% or more on serial  testing suggests a  clinically important change)  21-Jan-16 01:39   Troponin I < 0.02 (0.00-0.05 0.05 ng/mL or less: NEGATIVE  Repeat testing in 3-6 hrs  if clinically indicated. >0.05 ng/mL: POTENTIAL  MYOCARDIAL INJURY. Repeat  testing in 3-6 hrs if  clinically indicated. NOTE: An increase or decrease  of 30% or more on serial  testing suggests  a  clinically important change)    05:39   Troponin I < 0.02 (0.00-0.05 0.05 ng/mL or less: NEGATIVE  Repeat testing in 3-6 hrs  if clinically indicated. >0.05 ng/mL: POTENTIAL  MYOCARDIAL INJURY. Repeat  testing in 3-6 hrs if  clinically indicated. NOTE: An increase or decrease  of 30% or more on serial  testing suggests a  clinically important change)  Routine Hem:  20-Jan-16 21:39   WBC (CBC)  13.9  RBC (CBC) 4.67  Hemoglobin (CBC) 13.6  Hematocrit (CBC) 42.8  Platelet Count (CBC) 315 (Result(s) reported on 24 Feb 2014 at 10:08PM.)  MCV 92  MCH 29.1  MCHC  31.8  RDW 12.5   EKG:  EKG Interp. by me   Interpretation EKG shows a-fib with RVR, 164 bpm   Radiology Results: XRay:    20-Jan-16 22:00, Chest Portable Single View  Chest Portable Single View  REASON FOR EXAM:    Chest pain  COMMENTS:       PROCEDURE: DXR - DXR PORTABLE CHEST SINGLE VIEW  - Feb 24 2014 10:00PM     CLINICAL DATA:  c/o palpitations and the sensation of a "lump" in  her throat. Patient was seen here on Monday with the same symptoms;  EKG was normal; discharged being told that it was her "hormones."  Patient in new onset A.fib upon EMS encounter - rate in the  150-200s. Patient with (+) SOB; denies chest pain tonight, but  admits to intermittent pain under left breast the last two days, h/o  asthma    EXAM:  PORTABLE CHEST - 1 VIEW  COMPARISON:  02/22/2014    FINDINGS:  Cardiac silhouette normal in size configuration. Normal mediastinal  and hilar contours. Clear lungs. No pleural effusion or  pneumothorax. Bony thoraxis intact. No change from the prior study.     IMPRESSION:  No active disease.      Electronically Signed    By: Lajean Manes M.D.    On: 02/24/2014 22:08     Verified By: Lasandra Beech, M.D.,  Cardiology:    21-Jan-16 08:37, Echo Doppler  Echo Doppler   REASON FOR EXAM:      COMMENTS:       PROCEDURE: [facility] - ECHO DOPPLER COMPLETE(TRANSTHOR)  - Feb 25 2014   8:37AM     RESULT: Echocardiogram Report    Patient Name:   Kayla Castro Date of Exam: 02/25/2014  Medical Rec #:  [id number]          Custom1:  Date of Birth:  07/03/1970        Height:       66.0 in  Patient Age:    45 years        Weight:       149.0 lb  Patient Gender: F               BSA:          1.76 m??    Indications: Atrial Fibrillation  Sonographer:    Sherrie Sport RDCS  Referring Phys: Azucena Freed, N    Sonographer Comments: Suboptimal apical window.    Summary:   1. Essentially a normal study   2. Left ventricular ejection fraction, by visual estimation, is 60 to   65%.   3. Normal global left ventricular systolic function.   4. Normal right ventricular size and systolic function.   5. Normal RVSP  2D AND M-MODE MEASUREMENTS (normal ranges within parentheses):  Left Ventricle:          Normal  IVSd (2D):      0.88 cm (0.7-1.1)  LVPWd (2D):     1.28 cm (0.7-1.1) Aorta/LA:           Normal  LVIDd (2D):     3.61 cm (3.4-5.7) Aortic Root (2D): 2.60 cm (2.4-3.7)  LVIDs (2D):     2.30 cm           Left Atrium (2D): 2.80 cm (1.9-4.0)  LV FS (2D):     36.3 %   (>25%)  LV EF (2D):     66.9 %   (>50%)            Right Ventricle:  RVd (2D):        5.72 cm  LV DIASTOLIC FUNCTION:  MV Peak E: 0.87 m/s E/e' Ratio: 5.70  MV Peak A: 0.49 m/s Decel Time: 296 msec  E/A Ratio: 1.79  SPECTRAL DOPPLER ANALYSIS (where applicable):  Mitral Valve:  MV P1/2 Time: 85.84 msec  MV Area, PHT: 2.56 cm??  Aortic Valve: AoV Max Vel: 1.18 m/s AoV Peak PG: 5.6 mmHg AoV Mean PG:  LVOT Vmax: 1.08 m/s LVOT VTI:  LVOT Diameter: 2.00 cm  AoV Area, Vmax: 2.88 cm?? AoV Area, VTI:  AoV Area, Vmn:  Tricuspid Valve and PA/RV Systolic Pressure: TR Max Velocity: 1.42 m/s RA   Pressure: 5 mmHg RVSP/PASP: 13.1 mmHg  Pulmonic Valve:  PV Max Velocity: 0.88 m/s PV Max PG: 3.1 mmHg PV Mean PG:    PHYSICIAN INTERPRETATION:  Left Ventricle: The left ventricular internal  cavity size was normal. LV   posterior wall thickness was normal. Global LV systolic function was   normal. Left ventricular ejection fraction, by visual estimation, is 60   to 65%.  Right Ventricle: Normal right ventricular size, wall thickness, and   systolic function. The right ventricular size is normal. Global RV   systolic function is normal.  Left Atrium: The left atrium is normal in size.  Right Atrium: The right atrium is normal in size.  Pericardium: There is no evidence of pericardial effusion.  Mitral Valve: The mitral valve is normal in structure. Trace mitral valve   regurgitation is seen.  Tricuspid Valve: The tricuspid valve is normal. Trivial tricuspid   regurgitation is visualized. The tricuspid regurgitantvelocity is 1.42   m/s, and with an assumed right atrial pressure of 5 mmHg, the estimated   right ventricular systolic pressure is normal at 13.1 mmHg.  Aortic Valve: The aortic valve is normal. The aortic valve is   structurally normal, with no evidence of sclerosis or stenosis. No   evidence of aortic valve regurgitation is seen.  Aorta: The aortic root and ascending aorta are structurally normal, with   no evidence of dilitation.  [id number] Ida Rogue MD  Electronically signed by [id number] Ida Rogue MD  Signature Date/Time: 02/25/2014/9:38:24 AM    *** Final ***    IMPRESSION: .        Verified By: Minna Merritts, M.D., MD    Sulfa drugs: Other  Vital Signs/Nurse's Notes: **Vital Signs.:   21-Jan-16 08:01  Vital Signs Type Pre Medication  Temperature Temperature (F) 97.3  Celsius 36.2  Temperature Source oral  Pulse Pulse 71  Respirations Respirations 18  Systolic BP Systolic BP 93  Diastolic BP (mmHg) Diastolic BP (mmHg) 60  Mean BP 71  Pulse Ox % Pulse Ox % 99  Pulse Ox Activity Level  At rest  Oxygen Delivery Room Air/ 21 %    Impression 44 year old female with history of asthma, anemia, and kidney stones who presented to [facility] ED on  1/18 with palpitations that developed around 10 PM that day and was found to be in NSR with a rate in the 90s, troponin negative, and was sent home. She presented to [facility] ED via EMS on 1/21 overnight with onset of palpitations that began around 9 PM and was found to be in new onset a-fib with RVR with rates in the 160s.  1. New onset a-fib: started after ABD pain and diarrhea -Presented with heart rates in the 160s, now converted back NSR with rates in the 70s as of this morning -  CHADSVACs 1 (female) -Hold long term anticoagulation given the above CHADSVASc and what appears to be short duration of a-fib  -Blood pressure soft this morning in the 90s/60s without receiving any diltiazem, hold parameters were placed to hold with SBP <110. Will monitor her BP and pulse today, if she has appropriate response could treat with diltiazem 30 prn palpitations at home with follow up with [place] in 10-14 days  2. Primary prevention: -Patient's TC 185, LDL 111, HDL 62, TG 60 -Lifestyle changes -Father with MI in his 92s (non smoker, not a diabetic) -Follow up with PCP  3. Hypokalemia: -Potassium 3.7-->3.8 -Possibly 2/2 her GI upset/IBS/colitis  -May benefit from low dose KCl 10 meq daily   4. GI upset/possible IBS/colitis: -Longstaning issue -Outpatient follow up if patient desires vs lifestyle changes   Electronic Signatures: Rise Mu (PA-C)  (Signed 21-Jan-16 10:29)  Authored: General Aspect/Present Illness, History and Physical Exam, Review of System, Family & Social History, Past Medical History, Home Medications, Labs, EKG , Radiology, Allergies, Vital Signs/Nurse's Notes, Impression/Plan Ida Rogue (MD)  (Signed 21-Jan-16 19:18)  Authored: General Aspect/Present Illness, History and Physical Exam, Review of System, Family & Social History, EKG , Impression/Plan  Co-Signer: General Aspect/Present Illness, Home Medications, Allergies   Last Updated: 21-Jan-16 19:18 by Ida Rogue  (MD)

## 2014-06-22 ENCOUNTER — Other Ambulatory Visit: Payer: Self-pay | Admitting: Obstetrics and Gynecology

## 2014-06-22 DIAGNOSIS — N6489 Other specified disorders of breast: Secondary | ICD-10-CM

## 2014-07-01 NOTE — Patient Instructions (Addendum)
Stress test shows no changes concerning for ischemia Good exercise tolerance No further workup at this time

## 2014-07-16 ENCOUNTER — Telehealth: Payer: Self-pay | Admitting: Urgent Care

## 2014-07-16 NOTE — Telephone Encounter (Signed)
Patient is still waiting to hear about the test Dr Servando Snare is supposed to be ordering for her. In looking back at the correspondence in [employer], originally there was going to be a test done in the ENDO unit at [facility], but they haven't used the Bravo machine in 2 years. I spoke with Tommy Rainwater and she states Dr Servando Snare is supposed to order another test. Patient would like to speak with someone regarding this. Patient also stated she had recently been to her OBGYN and had blood work done, her B12 is low. Please call and advise. Thank you.

## 2014-07-21 ENCOUNTER — Telehealth: Payer: Self-pay | Admitting: Urgent Care

## 2014-07-21 NOTE — Telephone Encounter (Signed)
LM for return call.  Bravo is unavailable.  Pt needs appt with Dr Servando Snare to discuss next step. Thanks

## 2014-07-22 NOTE — Telephone Encounter (Signed)
Pt scheduled with Dr. Servando Snare on June 30th to discuss which test to order since Bravo machine is not working.

## 2014-08-05 ENCOUNTER — Ambulatory Visit (INDEPENDENT_AMBULATORY_CARE_PROVIDER_SITE_OTHER): Payer: BLUE CROSS/BLUE SHIELD | Admitting: Gastroenterology

## 2014-08-05 VITALS — BP 123/80 | HR 70 | Temp 98.2°F | Resp 20 | Ht 67.0 in | Wt 150.0 lb

## 2014-08-05 DIAGNOSIS — R1013 Epigastric pain: Secondary | ICD-10-CM

## 2014-08-05 DIAGNOSIS — M791 Myalgia, unspecified site: Secondary | ICD-10-CM

## 2014-08-05 MED ORDER — CYCLOBENZAPRINE HCL 5 MG PO TABS: 5.0000 mg | ORAL_TABLET | Freq: Three times a day (TID) | ORAL | Status: DC

## 2014-08-05 NOTE — Progress Notes (Signed)
Primary Care Physician: Sula RumpleVirk, Charanjit, MD  Primary Gastroenterologist:  Dr. Midge Miniumarren Jalaila Caradonna  Chief Complaint  Patient presents with  . Gastrophageal Reflux    symptoms started 02/2014 and has not resolved    HPI: Kayla Castro is a 44 y.o. female here comes for follow-up of her abdominal pain with worsening when she eats and reporting the pain to be in the epigastric area. The patient states that her workup by Dr. Bluford Kaufmannoh and Dr. Lemar LivingsByrnett have been negative. She states the pain is worse when she moves and bends over. She also reports that she has a feeling of fullness in her chest. She also reports the abdominal pain will sometimes be exacerbated by food.  Current Outpatient Prescriptions  Medication Sig Dispense Refill  . albuterol (PROVENTIL HFA;VENTOLIN HFA) 108 (90 BASE) MCG/ACT inhaler Inhale 2 puffs into the lungs every 6 (six) hours as needed.     . fluticasone (FLONASE) 50 MCG/ACT nasal spray Place into the nose.    . vitamin B-12 (CYANOCOBALAMIN) 1000 MCG tablet Take 1,000 mcg by mouth daily.    Marland Kitchen. VITAMIN B1-B12 IM Inject into the muscle every 30 (thirty) days.    . cyclobenzaprine (FLEXERIL) 5 MG tablet Take 1 tablet (5 mg total) by mouth 3 (three) times daily. 42 tablet 0  . diltiazem (CARDIZEM) 30 MG tablet Take 30 mg by mouth 3 (three) times daily as needed.    . Multiple Vitamin (MULTIVITAMIN) capsule Take 1 capsule by mouth daily.     No current facility-administered medications for this visit.    Allergies as of 08/05/2014 - Review Complete 08/05/2014  Allergen Reaction Noted  . Sulfa antibiotics Other (See Comments) 03/04/2014    ROS:  General: Negative for anorexia, weight loss, fever, chills, fatigue, weakness. ENT: Negative for hoarseness, difficulty swallowing , nasal congestion. CV: Negative for chest pain, angina, palpitations, dyspnea on exertion, peripheral edema.  Respiratory: Negative for dyspnea at rest, dyspnea on exertion, cough, sputum, wheezing.  GI:  See history of present illness. GU:  Negative for dysuria, hematuria, urinary incontinence, urinary frequency, nocturnal urination.  Endo: Negative for unusual weight change.    Physical Examination:   BP 123/80 mmHg  Pulse 70  Temp(Src) 98.2 F (36.8 C) (Oral)  Resp 20  Ht 5\' 7"  (1.702 m)  Wt 150 lb (68.04 kg)  BMI 23.49 kg/m2  LMP 02/05/2005  General: Well-nourished, well-developed in no acute distress.  Eyes: No icterus. Conjunctivae pink. Mouth: Oropharyngeal mucosa moist and pink , no lesions erythema or exudate. Lungs: Clear to auscultation bilaterally. Non-labored. Heart: Regular rate and rhythm, no murmurs rubs or gallops.  Abdomen: Bowel sounds are normal, tender with a positive carnet sign with 1 finger palpation of the abdominal wall muscles, nondistended, no hepatosplenomegaly or masses, no abdominal bruits or hernia , no rebound or guarding.   Extremities: No lower extremity edema. No clubbing or deformities. Neuro: Alert and oriented x 3.  Grossly intact. Skin: Warm and dry, no jaundice.   Psych: Alert and cooperative, normal mood and affect.  Labs:    Imaging Studies: No results found.  Assessment and Plan:   Kayla Castro is a 44 y.o. y/o female who comes today for follow-up of her abdominal issues including epigastric him for and dyspepsia. The patient's physical exam was consistent with abdominal wall tenderness. Her other symptoms may be required results of esophageal reflux versus esophageal dysmotility. The patient will be set up for a 24-hour pH study with esophageal manometry. Patient  has been explained the plan and agrees with it.

## 2014-08-10 ENCOUNTER — Ambulatory Visit: Payer: BLUE CROSS/BLUE SHIELD

## 2014-08-10 ENCOUNTER — Ambulatory Visit
Admission: RE | Admit: 2014-08-10 | Discharge: 2014-08-10 | Disposition: A | Discharge status: Home / Self Care | Payer: BLUE CROSS/BLUE SHIELD | Source: Ambulatory Visit | Attending: Obstetrics and Gynecology | Admitting: Obstetrics and Gynecology

## 2014-08-10 ENCOUNTER — Other Ambulatory Visit: Payer: Self-pay | Admitting: Obstetrics and Gynecology

## 2014-08-10 DIAGNOSIS — N6489 Other specified disorders of breast: Secondary | ICD-10-CM | POA: Diagnosis present

## 2014-08-10 DIAGNOSIS — R928 Other abnormal and inconclusive findings on diagnostic imaging of breast: Secondary | ICD-10-CM | POA: Insufficient documentation

## 2014-08-31 ENCOUNTER — Encounter: Payer: Self-pay | Admitting: Emergency Medicine

## 2014-08-31 ENCOUNTER — Emergency Department
Admission: EM | Admit: 2014-08-31 | Discharge: 2014-08-31 | Disposition: A | Discharge status: Home / Self Care | Payer: BLUE CROSS/BLUE SHIELD | Attending: Emergency Medicine | Admitting: Emergency Medicine

## 2014-08-31 ENCOUNTER — Telehealth: Payer: Self-pay

## 2014-08-31 DIAGNOSIS — Z7951 Long term (current) use of inhaled steroids: Secondary | ICD-10-CM | POA: Insufficient documentation

## 2014-08-31 DIAGNOSIS — Z79899 Other long term (current) drug therapy: Secondary | ICD-10-CM | POA: Insufficient documentation

## 2014-08-31 DIAGNOSIS — I480 Paroxysmal atrial fibrillation: Secondary | ICD-10-CM | POA: Diagnosis not present

## 2014-08-31 DIAGNOSIS — R000 Tachycardia, unspecified: Secondary | ICD-10-CM | POA: Diagnosis present

## 2014-08-31 HISTORY — DX: Unspecified atrial fibrillation: I48.91

## 2014-08-31 LAB — BASIC METABOLIC PANEL WITH GFR
Anion gap: 6 (ref 5–15)
BUN: 13 mg/dL (ref 6–20)
CO2: 27 mmol/L (ref 22–32)
Calcium: 9.3 mg/dL (ref 8.9–10.3)
Chloride: 106 mmol/L (ref 101–111)
Creatinine, Ser: 0.67 mg/dL (ref 0.44–1.00)
GFR calc Af Amer: >60 mL/min
GFR calc non Af Amer: >60 mL/min
Glucose, Bld: 100 mg/dL — ABNORMAL HIGH (ref 65–99)
Potassium: 3.5 mmol/L (ref 3.5–5.1)
Sodium: 139 mmol/L (ref 135–145)

## 2014-08-31 LAB — CBC
HCT: 39.2 % (ref 35.0–47.0)
Hemoglobin: 13 g/dL (ref 12.0–16.0)
MCH: 29.5 pg (ref 26.0–34.0)
MCHC: 33.3 g/dL (ref 32.0–36.0)
MCV: 88.6 fL (ref 80.0–100.0)
Platelets: 298 K/uL (ref 150–440)
RBC: 4.42 MIL/uL (ref 3.80–5.20)
RDW: 12.9 % (ref 11.5–14.5)
WBC: 7.8 K/uL (ref 3.6–11.0)

## 2014-08-31 LAB — FIBRIN DERIVATIVES D-DIMER (ARMC ONLY): Fibrin derivatives D-dimer (ARMC): 353 (ref 0–499)

## 2014-08-31 LAB — TROPONIN I: Troponin I: <0.03 ng/mL

## 2014-08-31 MED ORDER — IBUPROFEN 800 MG PO TABS: 800.0000 mg | ORAL_TABLET | Freq: Once | ORAL | Status: DC

## 2014-08-31 NOTE — Telephone Encounter (Signed)
Pt states she was in the ED last with afib. They advised pt to call us today.  Pt states she is not having any symptoms now, states she has GI problems, which she thinks sent her into afib. Please call.

## 2014-08-31 NOTE — ED Notes (Signed)
Patient ambulatory to triage with steady gait, without difficulty or distress noted; pt reports last feeling "symptoms of afib"; st heart racing and SOB

## 2014-08-31 NOTE — ED Provider Notes (Signed)
[facility] Emergency Department Provider Note  ____________________________________________  Time seen: 5:40 AM  I have reviewed the triage vital signs and the nursing notes.   HISTORY  Chief Complaint Tachycardia      HPI Kayla Castro is a 44 y.o. female presents emergency department with history of "heart fluttering" that occurred 30 minutes before presentation to the emergency department. Patient currently states that that sensation has since resolved. Patient denies any  chest pain no nausea or vomiting. Patient does admit to sensation of reflux with laying flat and dyspnea along with laying flat as well.     Past Medical History  Diagnosis Date  . Anemia   . History of kidney stones   . Palpitations   . New onset a-fib 2016    Dr. Mariah Milling  . GERD (gastroesophageal reflux disease)   . Colon polyp 2014  . Asthma     Dr. Mayo Ao  . Asthma, mild intermittent 03/04/2014  . Shortness of breath 03/04/2014  . Epigastric pain 05/14/2014  . Atrial fibrillation     Patient Active Problem List   Diagnosis Date Noted  . Epigastric pain 05/14/2014  . Palpitations 03/04/2014  . New onset a-fib 03/04/2014  . Shortness of breath 03/04/2014  . Asthma, mild intermittent 03/04/2014  . Epigastric abdominal pain 03/04/2014    Past Surgical History  Procedure Laterality Date  . Cesarean section      x 2  . Abdominal hysterectomy  2007  . Scar tissue excision x6  2004, 2006    x6  . Colonoscopy  2014    Dr Bluford Kaufmann  . Upper gi endoscopy  2014, 2016    Dr. Bluford Kaufmann    Current Outpatient Rx  Name  Route  Sig  Dispense  Refill  . albuterol (PROVENTIL HFA;VENTOLIN HFA) 108 (90 BASE) MCG/ACT inhaler   Inhalation   Inhale 2 puffs into the lungs every 6 (six) hours as needed.          . cyclobenzaprine (FLEXERIL) 5 MG tablet   Oral   Take 1 tablet (5 mg total) by mouth 3 (three) times daily.   42 tablet   0   . diltiazem (CARDIZEM) 30 MG tablet   Oral  Take 30 mg by mouth 3 (three) times daily as needed.         . fluticasone (FLONASE) 50 MCG/ACT nasal spray   Nasal   Place into the nose.         . Multiple Vitamin (MULTIVITAMIN) capsule   Oral   Take 1 capsule by mouth daily.         . vitamin B-12 (CYANOCOBALAMIN) 1000 MCG tablet   Oral   Take 1,000 mcg by mouth daily.         Marland Kitchen VITAMIN B1-B12 IM   Intramuscular   Inject into the muscle every 30 (thirty) days.           Allergies Sulfa antibiotics  Family History  Problem Relation Age of Onset  . Hypertension Mother   . Heart attack Father 16  . Heart disease Father   . CVA Mother     Social History History  Substance Use Topics  . Smoking status: Never Smoker   . Smokeless tobacco: Never Used  . Alcohol Use: No    Review of Systems  Constitutional: Negative for fever. Eyes: Negative for visual changes. ENT: Negative for sore throat. Cardiovascular: Negative for chest pain. Positive palpitations Respiratory: Negative for shortness of  breath. Gastrointestinal: Negative for abdominal pain, vomiting and diarrhea. Genitourinary: Negative for dysuria. Musculoskeletal: Negative for back pain. Skin: Negative for rash. Neurological: Negative for headaches, focal weakness or numbness.   10-point ROS otherwise negative.  ____________________________________________   PHYSICAL EXAM:  VITAL SIGNS: ED Triage Vitals  Enc Vitals Group     BP 08/31/14 0412 111/76 mmHg     Pulse Rate 08/31/14 0412 92     Resp 08/31/14 0412 18     Temp --      Temp Source 08/31/14 0412 Oral     SpO2 08/31/14 0412 100 %     Weight 08/31/14 0412 150 lb (68.04 kg)     Height 08/31/14 0412  (1.702 m)     Head Cir --      Peak Flow --      Pain Score --      Pain Loc --      Pain Edu? --      Excl. in GC? --     Constitutional: Alert and oriented. Well appearing and in no distress. Eyes: Conjunctivae are normal. PERRL. Normal extraocular movements. ENT    Head: Normocephalic and atraumatic.   Nose: No congestion/rhinnorhea.   Mouth/Throat: Mucous membranes are moist.   Neck: No stridor. Cardiovascular: Normal rate, regular rhythm. Normal and symmetric distal pulses are present in all extremities. No murmurs, rubs, or gallops. Respiratory: Normal respiratory effort without tachypnea nor retractions. Breath sounds are clear and equal bilaterally. No wheezes/rales/rhonchi. Gastrointestinal: Soft and nontender. No distention. There is no CVA tenderness. Genitourinary: deferred Musculoskeletal: Nontender with normal range of motion in all extremities. No joint effusions.  No lower extremity tenderness nor edema. Neurologic:  Normal speech and language. No gross focal neurologic deficits are appreciated. Speech is normal.  Skin:  Skin is warm, dry and intact. No rash noted. Psychiatric: Mood and affect are normal. Speech and behavior are normal. Patient exhibits appropriate insight and judgment.  ____________________________________________    LABS (pertinent positives/negatives)  Labs Reviewed  BASIC METABOLIC PANEL - Abnormal; Notable for the following:    Glucose, Bld 100 (*)    All other components within normal limits  CBC  TROPONIN I  FIBRIN DERIVATIVES D-DIMER ([facility] ONLY)     ____________________________________________   EKG  ED ECG REPORT I, BROWN, [place], the attending physician, personally viewed and interpreted this ECG.   Date: 08/31/2014  EKG Time: 4:12am  Rate: 92  Rhythm: Normal sinus rhythm  Axis: none  Intervals: Normal  ST&T Change: None     INITIAL IMPRESSION / ASSESSMENT AND PLAN / ED COURSE  Pertinent labs & imaging results that were available during my care of the patient were reviewed by me and considered in my medical decision making (see chart for details).  History and physical exam consistent with paroxysmal atrial fibrillation. Cardiac enzymes negative 2 d-dimer negative and  the patient had his PERC 0. However concern for possible paroxysmal atrial fibrillation. Patient not currently on any blood thinner followed by Dr. Mariah Milling. Patient will be referred to Dr. Mariah Milling for outpatient evaluation today  ____________________________________________   FINAL CLINICAL IMPRESSION(S) / ED DIAGNOSES  Final diagnoses:  Paroxysmal atrial fibrillation      Darci Current, MD 08/31/14 2259

## 2014-08-31 NOTE — Telephone Encounter (Signed)
No opening this week.

## 2014-08-31 NOTE — Telephone Encounter (Signed)
Pt worked in to see Dr. Mariah Milling tomorrow.

## 2014-08-31 NOTE — Telephone Encounter (Signed)
Do we have any openings this week that either Dr. Mariah Milling or Alycia Rossetti can see her?

## 2014-08-31 NOTE — ED Notes (Signed)
Patient present to ED with complaint of "heart fluttering" x  30 min PTA, patient states was diagnosed with a-fib. Patient reports shortness of breath at time, reports feels "my heart fluttering comes and goes."  Denies chest pain, abdominal pain, dizziness, or weakness. Patient alert and oriented x 4, respirations even and unlabored, call bell within reach, MD at bedside.

## 2014-08-31 NOTE — ED Notes (Signed)
Discharge instructions reviewed with patient as well as follow up recommendations. Patient verbalized understanding to all instructions as well as when to follow up.

## 2014-08-31 NOTE — Discharge Instructions (Signed)

## 2014-09-01 ENCOUNTER — Encounter: Payer: Self-pay | Admitting: Cardiovascular Disease

## 2014-09-01 ENCOUNTER — Ambulatory Visit (INDEPENDENT_AMBULATORY_CARE_PROVIDER_SITE_OTHER): Payer: BLUE CROSS/BLUE SHIELD | Admitting: Cardiovascular Disease

## 2014-09-01 VITALS — BP 100/72 | HR 80 | Ht 67.0 in | Wt 146.8 lb

## 2014-09-01 DIAGNOSIS — R0602 Shortness of breath: Secondary | ICD-10-CM

## 2014-09-01 DIAGNOSIS — R002 Palpitations: Secondary | ICD-10-CM

## 2014-09-01 DIAGNOSIS — J4520 Mild intermittent asthma, uncomplicated: Secondary | ICD-10-CM

## 2014-09-01 DIAGNOSIS — R1013 Epigastric pain: Secondary | ICD-10-CM

## 2014-09-01 DIAGNOSIS — I4891 Unspecified atrial fibrillation: Secondary | ICD-10-CM | POA: Diagnosis not present

## 2014-09-01 MED ORDER — FLECAINIDE ACETATE 50 MG PO TABS: 50.0000 mg | ORAL_TABLET | Freq: Two times a day (BID) | ORAL | Status: DC | PRN

## 2014-09-01 NOTE — Progress Notes (Signed)
Patient ID: Kayla Castro, female    DOB: 05/01/1970, 44 y.o.   MRN: [id number]  HPI Comments: Kayla Castro  Is a pleasant 44 year old woman with history of asthma, anemia, paroxysmal atrial fibrillation who presents for routine follow-up of her arrhythmia  She reports that she had recent abdominal trouble and shortly after went into atrial fibrillation lasting 30 minutes She went to the emergency room. By the time she was evaluated, she was back in normal sinus rhythm She has rare episodes but is symptomatic. Each episode seems to be associated with GI problems. She does have diltiazem but has not been taking this with episodes because her blood pressure is very low. Most recent episode, she did not take diltiazem as she was scared as her systolic was 100 She does have occasional palpitations  EKG on today's visit shows normal sinus rhythm with rate 80 bpm, no significant ST or T-wave changes  Other past medical history She presented to [facility] 02/25/14 with abdominal pain, diarrhea, EKG showing atrial fibrillation with heart rate 160 bpm, also with self-reported shortness of breath for several weeks prior to admission, potassium 3.7 on arrival, negative cardiac enzymes, essentially normal TSH, echocardiogram showing normal ejection fraction, normal right ventricular systolic pressure,   converting to normal sinus rhythm with heart rates in the 70s, discharged home   She does report a long history of periodic IBS/colitis symptoms.  History of asthma and takes inhalers periodically  EKG from the hospital showed atrial fibrillation with ventricular rate 164 bpm Echocardiogram reviewed with her 02/25/2014 showing normal ejection fraction, essentially normal study Total cholesterol 185, LDL 111 Prior stress test November 2009 showing no ischemia       Allergies  Allergen Reactions  . Sulfa Antibiotics Other (See Comments)    Flu like symptoms    Outpatient Encounter Prescriptions as of  09/01/2014  Medication Sig  . albuterol (PROVENTIL HFA;VENTOLIN HFA) 108 (90 BASE) MCG/ACT inhaler Inhale 2 puffs into the lungs every 6 (six) hours as needed.   . diltiazem (CARDIZEM) 30 MG tablet Take 30 mg by mouth 3 (three) times daily as needed.  . Multiple Vitamin (MULTIVITAMIN) capsule Take 1 capsule by mouth daily.  . vitamin B-12 (CYANOCOBALAMIN) 1000 MCG tablet Take 1,000 mcg by mouth daily.  Marland Kitchen VITAMIN B1-B12 IM Inject into the muscle every 30 (thirty) days.  . flecainide (TAMBOCOR) 50 MG tablet Take 1 tablet (50 mg total) by mouth 2 (two) times daily as needed.  . [DISCONTINUED] cyclobenzaprine (FLEXERIL) 5 MG tablet Take 1 tablet (5 mg total) by mouth 3 (three) times daily. (Patient not taking: Reported on 09/01/2014)  . [DISCONTINUED] fluticasone (FLONASE) 50 MCG/ACT nasal spray Place into the nose.   No facility-administered encounter medications on file as of 09/01/2014.    Past Medical History  Diagnosis Date  . Anemia   . History of kidney stones   . Palpitations   . New onset a-fib 2016    Dr. Mariah Milling  . GERD (gastroesophageal reflux disease)   . Colon polyp 2014  . Asthma     Dr. Mayo Ao  . Asthma, mild intermittent 03/04/2014  . Shortness of breath 03/04/2014  . Epigastric pain 05/14/2014  . Atrial fibrillation     Past Surgical History  Procedure Laterality Date  . Cesarean section      x 2  . Abdominal hysterectomy  2007  . Scar tissue excision x6  2004, 2006    x6  . Colonoscopy  2014  Dr Bluford Kaufmann  . [facility] gi endoscopy  2014, 2016    Dr. Bluford Kaufmann    Social History  reports that she has never smoked. She has never used smokeless tobacco. She reports that she does not drink alcohol or use illicit drugs.  Family History family history includes CVA in her mother; Heart attack (age of onset: 25) in her father; Heart disease in her father; Hypertension in her mother.   Review of Systems  Constitutional: Negative.   Respiratory: Negative.   Cardiovascular:  Positive for palpitations.  Gastrointestinal: Positive for nausea and abdominal pain.  Endocrine: Negative.   Musculoskeletal: Negative.   Skin: Negative.   Neurological: Negative.   Hematological: Negative.   Psychiatric/Behavioral: Negative.   All other systems reviewed and are negative.   BP 100/72 mmHg  Pulse 80  Ht  (1.702 m)  Wt 146 lb 12 oz (66.565 kg)  BMI 22.98 kg/m2  LMP 02/05/2005  Physical Exam  Constitutional: She is oriented to person, place, and time. She appears well-developed and well-nourished.  HENT:  Head: Normocephalic.  Nose: Nose normal.  Mouth/Throat: Oropharynx is clear and moist.  Eyes: Conjunctivae are normal. Pupils are equal, round, and reactive to light.  Neck: Normal range of motion. Neck supple. No JVD present.  Cardiovascular: Normal rate, regular rhythm, S1 normal, S2 normal, normal heart sounds and intact distal pulses.  Exam reveals no gallop and no friction rub.   No murmur heard. Pulmonary/Chest: Effort normal and breath sounds normal. No respiratory distress. She has no wheezes. She has no rales. She exhibits no tenderness.  Abdominal: Soft. Bowel sounds are normal. She exhibits no distension. There is no tenderness.  Musculoskeletal: Normal range of motion. She exhibits no edema or tenderness.  Lymphadenopathy:    She has no cervical adenopathy.  Neurological: She is alert and oriented to person, place, and time. Coordination normal.  Skin: Skin is warm and dry. No rash noted. No erythema.  Psychiatric: She has a normal mood and affect. Her behavior is normal. Judgment and thought content normal.    Assessment and Plan  Nursing note and vitals reviewed.

## 2014-09-01 NOTE — Patient Instructions (Signed)
You are doing well.  If you have an episode of atrial fibrillation, Take flecainide as needed (1 to 2 pills at a time) Ok to repeat 1 to 2 hours later if still in atrial fibrillation  Please call us if you have new issues that need to be addressed before your next appt.  Your physician wants you to follow-up in: 6 months.  You will receive a reminder letter in the mail two months in advance. If you don't receive a letter, please call our office to schedule the follow-up appointment.

## 2014-09-01 NOTE — Assessment & Plan Note (Signed)
She has further follow-up testing with GI for her continued symptoms

## 2014-09-01 NOTE — Assessment & Plan Note (Signed)
Rare episodes of palpitations. Likely ectopy though unable to exclude short runs of atrial fibrillation. These are rare

## 2014-09-01 NOTE — Assessment & Plan Note (Signed)
Shortness of breath symptoms are stable, very mild. No further testing at this time

## 2014-09-01 NOTE — Assessment & Plan Note (Signed)
She continues to have episodes of paroxysmal atrial fibrillation. She is scared of taking diltiazem or other beta blockers given her very low blood pressure. We discussed the treatment options with her. We have recommended she take flecainide 50 mg up to 100 mg as needed for breakthrough atrial fibrillation. If she continues to have arrhythmia several hours later, repeat dosing could be given. Recommended she call our office for recurrent symptoms

## 2014-09-01 NOTE — Assessment & Plan Note (Signed)
On albuterol inhaler periodically

## 2014-10-08 ENCOUNTER — Ambulatory Visit
Admission: RE | Admit: 2014-10-08 | Discharge: 2014-10-08 | Disposition: A | Discharge status: Home / Self Care | Payer: BLUE CROSS/BLUE SHIELD | Source: Ambulatory Visit | Attending: Gastroenterology | Admitting: Gastroenterology

## 2014-10-08 ENCOUNTER — Encounter
Admission: RE | Disposition: A | Discharge status: Home / Self Care | Payer: Self-pay | Source: Ambulatory Visit | Attending: Gastroenterology

## 2014-10-08 ENCOUNTER — Encounter: Payer: Self-pay | Admitting: Anesthesiology

## 2014-10-08 ENCOUNTER — Ambulatory Visit: Payer: BLUE CROSS/BLUE SHIELD | Admitting: Anesthesiology

## 2014-10-08 DIAGNOSIS — Z90710 Acquired absence of both cervix and uterus: Secondary | ICD-10-CM | POA: Insufficient documentation

## 2014-10-08 DIAGNOSIS — Z87442 Personal history of urinary calculi: Secondary | ICD-10-CM | POA: Insufficient documentation

## 2014-10-08 DIAGNOSIS — J45909 Unspecified asthma, uncomplicated: Secondary | ICD-10-CM | POA: Diagnosis not present

## 2014-10-08 DIAGNOSIS — I4891 Unspecified atrial fibrillation: Secondary | ICD-10-CM | POA: Diagnosis not present

## 2014-10-08 DIAGNOSIS — K219 Gastro-esophageal reflux disease without esophagitis: Secondary | ICD-10-CM | POA: Insufficient documentation

## 2014-10-08 DIAGNOSIS — R110 Nausea: Secondary | ICD-10-CM | POA: Diagnosis present

## 2014-10-08 DIAGNOSIS — Z9889 Other specified postprocedural states: Secondary | ICD-10-CM | POA: Diagnosis not present

## 2014-10-08 DIAGNOSIS — Z8249 Family history of ischemic heart disease and other diseases of the circulatory system: Secondary | ICD-10-CM | POA: Diagnosis not present

## 2014-10-08 DIAGNOSIS — Z79899 Other long term (current) drug therapy: Secondary | ICD-10-CM | POA: Insufficient documentation

## 2014-10-08 DIAGNOSIS — Z823 Family history of stroke: Secondary | ICD-10-CM | POA: Insufficient documentation

## 2014-10-08 DIAGNOSIS — R1013 Epigastric pain: Secondary | ICD-10-CM | POA: Diagnosis present

## 2014-10-08 HISTORY — PX: ESOPHAGOGASTRODUODENOSCOPY (EGD) WITH PROPOFOL: SHX5813

## 2014-10-08 SURGERY — ESOPHAGOGASTRODUODENOSCOPY (EGD) WITH PROPOFOL
Anesthesia: General

## 2014-10-08 MED ORDER — SODIUM CHLORIDE 0.9 % IV SOLN
INTRAVENOUS | Status: DC
  Administered 2014-10-08: 1000 mL via INTRAVENOUS

## 2014-10-08 MED ORDER — SODIUM CHLORIDE 0.9 % IV SOLN: INTRAVENOUS | Status: DC

## 2014-10-08 MED ORDER — MIDAZOLAM HCL 5 MG/5ML IJ SOLN
INTRAMUSCULAR | Status: AC
  Administered 2014-10-08: 1 mg via INTRAVENOUS
  Filled 2014-10-08: qty 5

## 2014-10-08 MED ORDER — PROPOFOL 10 MG/ML IV BOLUS
INTRAVENOUS | Status: DC | PRN
  Administered 2014-10-08: 30 mg via INTRAVENOUS
  Administered 2014-10-08 (×4): 20 mg via INTRAVENOUS

## 2014-10-08 NOTE — Anesthesia Postprocedure Evaluation (Signed)
  Anesthesia Post-op Note  Patient: Kayla Castro  Procedure(s) Performed: Procedure(s): ESOPHAGOGASTRODUODENOSCOPY (EGD) WITH PROPOFOL (N/A)  Anesthesia type:General  Patient location: PACU  Post pain: Pain level controlled  Post assessment: Post-op Vital signs reviewed, Patient's Cardiovascular Status Stable, Respiratory Function Stable, Patent Airway and No signs of Nausea or vomiting  Post vital signs: Reviewed and stable  Last Vitals:  Filed Vitals:   10/08/14 0728  BP: 120/79  Pulse: 79  Temp: 36.6 C  Resp: 16    Level of consciousness: awake, alert  and patient cooperative  Complications: No apparent anesthesia complications

## 2014-10-08 NOTE — Transfer of Care (Signed)
Immediate Anesthesia Transfer of Care Note  Patient: Kayla Castro  Procedure(s) Performed: Procedure(s): ESOPHAGOGASTRODUODENOSCOPY (EGD) WITH PROPOFOL (N/A)  Patient Location: PACU and Endoscopy Unit  Anesthesia Type:General  Level of Consciousness: alert   Airway & Oxygen Therapy: Patient Spontanous Breathing and Patient connected to nasal cannula oxygen  Post-op Assessment: Report given to RN and Post -op Vital signs reviewed and stable  Post vital signs: stable  Last Vitals:  Filed Vitals:   10/08/14 0728  BP: 120/79  Pulse: 79  Temp: 36.6 C  Resp: 16    Complications: No apparent anesthesia complications

## 2014-10-08 NOTE — Anesthesia Preprocedure Evaluation (Signed)
Anesthesia Evaluation    Airway Mallampati: II       Dental  (+) Teeth Intact   Pulmonary shortness of breath, asthma ,    Pulmonary exam normal       Cardiovascular Atrial Fibrillation Rhythm:regular Rate:Normal     Neuro/Psych    GI/Hepatic GERD-  ,  Endo/Other    Renal/GU      Musculoskeletal   Abdominal   Peds  Hematology  (+) anemia ,   Anesthesia Other Findings   Reproductive/Obstetrics                             Anesthesia Physical Anesthesia Plan  ASA: III  Anesthesia Plan: General   Post-op Pain Management:    Induction:   Airway Management Planned:   Additional Equipment:   Intra-op Plan:   Post-operative Plan:   Informed Consent:   Plan Discussed with: Anesthesiologist  Anesthesia Plan Comments:         Anesthesia Quick Evaluation

## 2014-10-08 NOTE — H&P (Signed)
Primary Care Physician:  Sula Rumple, MD Primary Gastroenterologist:  Dr. Bluford Kaufmann  Pre-Procedure History & Physical: HPI:  Kayla Castro is a 44 y.o. female is here for an EGD.   Past Medical History  Diagnosis Date  . Anemia   . History of kidney stones   . Palpitations   . New onset a-fib 2016    Dr. Mariah Milling  . GERD (gastroesophageal reflux disease)   . Colon polyp 2014  . Asthma     Dr. Mayo Ao  . Asthma, mild intermittent 03/04/2014  . Shortness of breath 03/04/2014  . Epigastric pain 05/14/2014  . Atrial fibrillation     Past Surgical History  Procedure Laterality Date  . Cesarean section      x 2  . Abdominal hysterectomy  2007  . Scar tissue excision x6  2004, 2006    x6  . Colonoscopy  2014    Dr Bluford Kaufmann  . Upper gi endoscopy  2014, 2016    Dr. Bluford Kaufmann    Prior to Admission medications   Medication Sig Start Date End Date Taking? Authorizing Provider  albuterol (PROVENTIL HFA;VENTOLIN HFA) 108 (90 BASE) MCG/ACT inhaler Inhale 2 puffs into the lungs every 6 (six) hours as needed.  10/06/13  Yes Historical Provider, MD  ciclopirox (PENLAC) 8 % solution Apply topically at bedtime. Apply over nail and surrounding skin. Apply daily over previous coat. After seven (7) days, may remove with alcohol and continue cycle.   Yes Historical Provider, MD  citalopram (CELEXA) 20 MG tablet Take 20 mg by mouth daily.   Yes Historical Provider, MD  diltiazem (CARDIZEM) 30 MG tablet Take 30 mg by mouth 3 (three) times daily as needed.   Yes Historical Provider, MD  flecainide (TAMBOCOR) 50 MG tablet Take 1 tablet (50 mg total) by mouth 2 (two) times daily as needed. 09/01/14  Yes Antonieta Iba, MD  Multiple Vitamin (MULTIVITAMIN) capsule Take 1 capsule by mouth daily.   Yes Historical Provider, MD  VITAMIN B1-B12 IM Inject into the muscle every 30 (thirty) days.   Yes Historical Provider, MD  vitamin B-12 (CYANOCOBALAMIN) 1000 MCG tablet Take 1,000 mcg by mouth daily.    Historical  Provider, MD    Allergies as of 09/10/2014 - Review Complete 09/01/2014  Allergen Reaction Noted  . Sulfa antibiotics Other (See Comments) 03/04/2014    Family History  Problem Relation Age of Onset  . Hypertension Mother   . Heart attack Father 69  . Heart disease Father   . CVA Mother     Social History   Social History  . Marital Status: Married    Spouse Name: N/A  . Number of Children: N/A  . Years of Education: N/A   Occupational History  . Not on file.   Social History Main Topics  . Smoking status: Never Smoker   . Smokeless tobacco: Never Used  . Alcohol Use: No  . Drug Use: No  . Sexual Activity: Not on file   Other Topics Concern  . Not on file   Social History Narrative    Review of Systems: See HPI, otherwise negative ROS  Physical Exam: BP 120/79 mmHg  Pulse 79  Temp(Src) 97.8 F (36.6 C) (Tympanic)  Resp 16  Ht  (1.702 m)  Wt 68.04 kg (150 lb)  BMI 23.49 kg/m2  SpO2 100%  LMP 02/05/2005 General:   Alert,  pleasant and cooperative in NAD Head:  Normocephalic and atraumatic. Neck:  Supple;  no masses or thyromegaly. Lungs:  Clear throughout to auscultation.    Heart:  Regular rate and rhythm. Abdomen:  Soft, nontender and nondistended. Normal bowel sounds, without guarding, and without rebound.   Neurologic:  Alert and  oriented x4;  grossly normal neurologically.  Impression/Plan: Kayla Castro is here for an EGD to be performed for GERD and 24h pH study. Risks, benefits, limitations, and alternatives regarding  EGD have been reviewed with the patient.  Questions have been answered.  All parties agreeable.   Kayla Castro, Ezzard Standing, MD  10/08/2014, 8:00 AM

## 2014-10-08 NOTE — Op Note (Signed)
[facility] Patient Name: Kayla Castro Procedure Date: 10/08/2014 7:31 AM MRN: [id number] Account #: [id number] Date of Birth: [birth year] Admit Type: Outpatient Age: 44 Room: [facility] [place] ENDO ROOM 4 Gender: Female Note Status: Finalized Procedure:         Upper GI endoscopy Indications:       Suspected esophageal reflux, Suspected gastro-esophageal                     reflux disease, Here for 24 pH study. Pt has been taking                     prilosec daily. Providers:         Ezzard Standing. Bluford Kaufmann, MD Referring MD:      Haynes Kerns (Referring MD) Medicines:         Monitored Anesthesia Care Complications:     No immediate complications. Procedure:         Pre-Anesthesia Assessment:                    - Prior to the procedure, a History and Physical was                     performed, and patient medications, allergies and                     sensitivities were reviewed. The patient's tolerance of                     previous anesthesia was reviewed.                    - The risks and benefits of the procedure and the sedation                     options and risks were discussed with the patient. All                     questions were answered and informed consent was obtained.                    - After reviewing the risks and benefits, the patient was                     deemed in satisfactory condition to undergo the procedure.                    After obtaining informed consent, the endoscope was passed                     under direct vision. Throughout the procedure, the                     patient's blood pressure, pulse, and oxygen saturations                     were monitored continuously. The Endoscope was introduced                     through the mouth, and advanced to the second part of                     duodenum. The upper GI endoscopy was accomplished without  difficulty. The patient tolerated the procedure  well. Findings:      The examined esophagus was normal. LES at 37cm from incisors.      The entire examined stomach was normal.      The examined duodenum was normal. Impression:        - Normal esophagus.                    - Normal stomach.                    - Normal examined duodenum.                    - No specimens collected. Recommendation:    - Discharge patient to home.                    - Observe patient's clinical course.                    - Continue present medications.                    - The findings and recommendations were discussed with the                     patient.                    - Await pH study results. To return tomorrow to remove pH                     probe. Procedure Code(s): --- Professional ---                    [phone], Esophagogastroduodenoscopy, flexible, transoral;                     diagnostic, including collection of specimen(s) by                     brushing or washing, when performed (separate procedure) CPT copyright 2014 [employer]. All rights reserved. The codes documented in this report are preliminary and upon coder review may  be revised to meet current compliance requirements. Wallace Cullens, MD 10/08/2014 8:29:47 AM This report has been signed electronically. Number of Addenda: 0 Note Initiated On: 10/08/2014 7:31 AM      [facility]

## 2014-10-21 ENCOUNTER — Encounter: Payer: Self-pay | Admitting: Emergency Medicine

## 2014-10-21 ENCOUNTER — Ambulatory Visit
Admission: EM | Admit: 2014-10-21 | Discharge: 2014-10-21 | Disposition: A | Discharge status: Home / Self Care | Payer: BLUE CROSS/BLUE SHIELD | Attending: Family Medicine | Admitting: Family Medicine

## 2014-10-21 DIAGNOSIS — B349 Viral infection, unspecified: Secondary | ICD-10-CM

## 2014-10-21 DIAGNOSIS — R5383 Other fatigue: Secondary | ICD-10-CM | POA: Diagnosis present

## 2014-10-21 NOTE — ED Provider Notes (Signed)
CSN: [id number]     Arrival date & time 10/21/14  1832 History   None    Chief Complaint  Patient presents with  . Fatigue   (Consider location/radiation/quality/duration/timing/severity/associated sxs/prior Treatment) HPI 44 yo F reports with non-specific findings "just dont feel good last few days", occasional warm flash,chest and head no NVD, Not much appetitie, fatigue. Has used her inhaler more than usual. Has recently had complete  Int Med /GI endoscopy Work up with labwork she reports was normal. In the Spring reports an episode of Afib .  Denies thyroid issues. Has had sensation of unable to get her breath , but realizes breathing well - throat and chest tension without pain-  none of which are happening tonight. Has some post nasal drip as we discuss symptoms may be experiencing seasonal allergies As there had been a significant increase in last 10 days c/w weed/ragweed release Past Medical History  Diagnosis Date  . Anemia   . History of kidney stones   . Palpitations   . New onset a-fib 2016    Dr. Rockey Situ  . GERD (gastroesophageal reflux disease)   . Colon polyp 2014  . Asthma     Dr. Vella Kohler  . Asthma, mild intermittent 03/04/2014  . Shortness of breath 03/04/2014  . Epigastric pain 05/14/2014  . Atrial fibrillation    Past Surgical History  Procedure Laterality Date  . Cesarean section      x 2  . Abdominal hysterectomy  2007  . Scar tissue excision x6  2004, 2006    x6  . Colonoscopy  2014    Dr Candace Cruise  . Upper gi endoscopy  2014, 2016    Dr. Candace Cruise  . Esophagogastroduodenoscopy (egd) with propofol N/A 10/08/2014    Procedure: ESOPHAGOGASTRODUODENOSCOPY (EGD) WITH PROPOFOL;  Surgeon: Hulen Luster, MD;  Location: [facility] ENDOSCOPY;  Service: Gastroenterology;  Laterality: N/A;   Family History  Problem Relation Age of Onset  . Hypertension Mother   . Heart attack Father 56  . Heart disease Father   . CVA Mother    Social History  Substance Use Topics  . Smoking status:  Never Smoker   . Smokeless tobacco: Never Used  . Alcohol Use: No   OB History    Gravida Para Term Preterm AB TAB SAB Ectopic Multiple Living   2 2              Obstetric Comments   1st Menstrual Cycle:  ? 1st Pregnancy:  31     Review of Systems  .Constitutional -afebrile, but warm flushes Eyes-denies visual changes ENT- normal voice,denies sore throat, nasal drip irritation CV-denies chest pain Resp-denies SOB GI- negative for nausea,vomiting, diarrhea, has anorexia GU- negative for dysuria MSK- negative for back pain, ambulatory Skin- denies acute changes Neuro- negative headache,focal weakness or numbness    Allergies  Sulfa antibiotics  Home Medications   Prior to Admission medications   Medication Sig Start Date End Date Taking? Authorizing Provider  albuterol (PROVENTIL HFA;VENTOLIN HFA) 108 (90 BASE) MCG/ACT inhaler Inhale 2 puffs into the lungs every 6 (six) hours as needed.  10/06/13   Historical Provider, MD  ciclopirox (PENLAC) 8 % solution Apply topically at bedtime. Apply over nail and surrounding skin. Apply daily over previous coat. After seven (7) days, may remove with alcohol and continue cycle.    Historical Provider, MD  citalopram (CELEXA) 20 MG tablet Take 20 mg by mouth daily.    Historical Provider, MD  diltiazem [place])  30 MG tablet Take 30 mg by mouth 3 (three) times daily as needed.    Historical Provider, MD  flecainide (TAMBOCOR) 50 MG tablet Take 1 tablet (50 mg total) by mouth 2 (two) times daily as needed. 09/01/14   Minna Merritts, MD  Multiple Vitamin (MULTIVITAMIN) capsule Take 1 capsule by mouth daily.    Historical Provider, MD  vitamin B-12 (CYANOCOBALAMIN) 1000 MCG tablet Take 1,000 mcg by mouth daily.    Historical Provider, MD  VITAMIN B1-B12 IM Inject into the muscle every 30 (thirty) days.    Historical Provider, MD   Meds Ordered and Administered this Visit  Medications - No data to display  BP 129/77 mmHg  Pulse 94   Temp(Src) 98.8 F (37.1 C)  Resp 16  Ht $R'5\' 7"'dG$  (1.702 m)  Wt 150 lb (68.04 kg)  BMI 23.49 kg/m2  SpO2 99%  LMP 02/05/2005 No data found.   Physical Exam Constitutional: Alert and oriented, well appearing, VS are noted,  General : no acute distress; HEENT:  Head:normocephalic, atraumatic,                Eyes: conjugate gaze,negative conjunctiva     Ears:Bilateral canals and tympanic membranes WNL     Nose:normal,     Mouth/throat :Mucous membranes moist, No pharyngeal erythema,very difficult to visualize oral cavity Neck :  supple without thyromegaly Lymph: without enlargement anterior cervical chain -though patient feels it is swollen Lung:   effort and breath sounds normal , no distress Heart:   normal rate,regular rhythm, no evidence Afib-- EKG confirms NSR Back:    No CVAT, no spinal tenderness noted Abd :    soft, nontender, bowel sounds present, no guarding or rebound,no organomegaly appreciated MSK:   nontender, normal ROM all extremities;normal flexion; ambulatory, on and off table without assistance Neuro: CN ll-Xl as tested,grossly intact; normal gait, normal speech and language Skin:  Warm,dry,intact Psych: mood and affect WNL  ED Course  Procedures (including critical care time)  Labs Review Labs Reviewed - No data to display  Imaging Review No results found.  Have discussed with patient that her VS are all normal. Her EKG as well.( shared with her) Her symptoms include post nasal drip and scratchy throat. C/w seasonal allergies She has had a myriad of issues and labs followed by PCP quite recently and will call triage nurse to discuss Claritin/Zyrtec with them  She uses Flonase at bedtime now,encouraded to split dose to AM /PM and observe. Taking HS Benadryl and has AM fatigue. There does not appear to be anything this evening of life-threatening nature and she will follow up with her PCP. Report to ER of choice if she develops concerns. Very relaxed at time of  discharge     MDM   1. Viral syndrome    Diagnosis /impressions discussed. . Questions fielded, expectations and recommendations reviewed.  Patient expresses understanding. Will return to [facility] with questions, concern or exacerbation.     Jan Fireman, PA-C 10/21/14 2138

## 2014-10-21 NOTE — ED Notes (Signed)
Fatigue, swollen glands, feels flushed over face, no appetite for 2 days

## 2014-11-02 ENCOUNTER — Other Ambulatory Visit: Payer: Self-pay | Admitting: Family Medicine

## 2014-11-02 DIAGNOSIS — M7989 Other specified soft tissue disorders: Secondary | ICD-10-CM

## 2014-11-05 ENCOUNTER — Ambulatory Visit: Payer: BLUE CROSS/BLUE SHIELD

## 2014-11-09 ENCOUNTER — Ambulatory Visit
Admission: RE | Admit: 2014-11-09 | Discharge: 2014-11-09 | Disposition: A | Discharge status: Home / Self Care | Payer: BLUE CROSS/BLUE SHIELD | Source: Ambulatory Visit | Attending: Family Medicine | Admitting: Family Medicine

## 2014-11-09 DIAGNOSIS — M7989 Other specified soft tissue disorders: Secondary | ICD-10-CM | POA: Insufficient documentation

## 2015-04-20 ENCOUNTER — Ambulatory Visit: Payer: BLUE CROSS/BLUE SHIELD | Admitting: Cardiovascular Disease

## 2015-05-05 ENCOUNTER — Encounter: Payer: Self-pay | Admitting: Cardiovascular Disease

## 2015-05-05 ENCOUNTER — Ambulatory Visit (INDEPENDENT_AMBULATORY_CARE_PROVIDER_SITE_OTHER): Payer: BLUE CROSS/BLUE SHIELD | Admitting: Cardiovascular Disease

## 2015-05-05 VITALS — BP 118/68 | HR 81 | Ht 67.0 in | Wt 159.0 lb

## 2015-05-05 DIAGNOSIS — R1013 Epigastric pain: Secondary | ICD-10-CM | POA: Diagnosis not present

## 2015-05-05 DIAGNOSIS — I480 Paroxysmal atrial fibrillation: Secondary | ICD-10-CM

## 2015-05-05 DIAGNOSIS — I4891 Unspecified atrial fibrillation: Secondary | ICD-10-CM | POA: Diagnosis not present

## 2015-05-05 NOTE — Progress Notes (Signed)
Patient ID: Kayla Castro, female    DOB: 05/24/1970, 45 y.o.   MRN: [id number]  HPI Comments: Kayla Castro  Is a pleasant 45 year old woman with history of asthma, anemia, paroxysmal atrial fibrillation who presents for routine follow-up of her arrhythmia. Symptoms typically start after GI distress  In follow-up today, she reports having only one episode of atrial fibrillation She had GI distress overnight, lasted several hours, cramping, eventually had loose bowel movement She developed tachycardia, irregular rhythm during this time. After 1.5 hours, she took flecainide 50 mg and then went back to sleep, when she woke up symptoms had resolved.  Occasionally has symptoms of flushing occasionally has pins and needles in her hands and fingers, etiology unclear  Reports she has seen GI for her stomach cramping and loose bowel movements Contemplating getting testing for gluten allergy Potassium level typically runs low  EKG on today's visit shows normal sinus rhythm with rate 81 bpm, no significant ST or T-wave changes  Other past medical history She presented to [facility] 02/25/14 with abdominal pain, diarrhea, EKG showing atrial fibrillation with heart rate 160 bpm, also with self-reported shortness of breath for several weeks prior to admission, potassium 3.7 on arrival, negative cardiac enzymes, essentially normal TSH, echocardiogram showing normal ejection fraction, normal right ventricular systolic pressure,   converting to normal sinus rhythm with heart rates in the 70s, discharged home   She does report a long history of periodic IBS/colitis symptoms.  History of asthma and takes inhalers periodically  EKG from the hospital showed atrial fibrillation with ventricular rate 164 bpm Echocardiogram reviewed with her 02/25/2014 showing normal ejection fraction, essentially normal study Total cholesterol 185, LDL 111 Prior stress test November 2009 showing no ischemia       Allergies   Allergen Reactions  . Sulfa Antibiotics Other (See Comments)    Flu like symptoms    Outpatient Encounter Prescriptions as of 05/05/2015  Medication Sig  . albuterol (PROVENTIL HFA;VENTOLIN HFA) 108 (90 BASE) MCG/ACT inhaler Inhale 2 puffs into the lungs every 6 (six) hours as needed.   . flecainide (TAMBOCOR) 50 MG tablet Take 1 tablet (50 mg total) by mouth 2 (two) times daily as needed.  . montelukast (SINGULAIR) 10 MG tablet Take 10 mg by mouth at bedtime.  . Multiple Vitamin (MULTIVITAMIN) capsule Take 1 capsule by mouth daily. Reported on 05/05/2015  . [DISCONTINUED] ciclopirox (PENLAC) 8 % solution Apply topically at bedtime. Reported on 05/05/2015  . [DISCONTINUED] citalopram (CELEXA) 20 MG tablet Take 20 mg by mouth daily. Reported on 05/05/2015  . [DISCONTINUED] diltiazem (CARDIZEM) 30 MG tablet Take 30 mg by mouth 3 (three) times daily as needed. Reported on 05/05/2015  . [DISCONTINUED] vitamin B-12 (CYANOCOBALAMIN) 1000 MCG tablet Take 1,000 mcg by mouth daily. Reported on 05/05/2015  . [DISCONTINUED] VITAMIN B1-B12 IM Inject into the muscle every 30 (thirty) days. Reported on 05/05/2015   No facility-administered encounter medications on file as of 05/05/2015.    Past Medical History  Diagnosis Date  . Anemia   . History of kidney stones   . Palpitations   . New onset a-fib [facility](HCC) 2016    Dr. Mariah MillingGollan  . GERD (gastroesophageal reflux disease)   . Colon polyp 2014  . Asthma     Dr. Mayo AoFlemming  . Asthma, mild intermittent 03/04/2014  . Shortness of breath 03/04/2014  . Epigastric pain 05/14/2014  . Atrial fibrillation [facility](HCC)     Past Surgical History  Procedure Laterality Date  . Cesarean section  x 2  . Abdominal hysterectomy  2007  . Scar tissue excision x6  2004, 2006    x6  . Colonoscopy  2014    Dr Bluford Kaufmann  . Upper gi endoscopy  2014, 2016    Dr. Bluford Kaufmann  . Esophagogastroduodenoscopy (egd) with propofol N/A 10/08/2014    Procedure: ESOPHAGOGASTRODUODENOSCOPY (EGD) WITH  PROPOFOL;  Surgeon: Wallace Cullens, MD;  Location: [facility] ENDOSCOPY;  Service: Gastroenterology;  Laterality: N/A;    Social History  reports that she has never smoked. She has never used smokeless tobacco. She reports that she does not drink alcohol or use illicit drugs.  Family History family history includes CVA in her mother; Heart attack (age of onset: 48) in her father; Heart disease in her father; Hypertension in her mother.   Review of Systems  Constitutional: Negative.   Respiratory: Negative.   Cardiovascular: Positive for palpitations.  Gastrointestinal: Positive for nausea, abdominal pain and diarrhea.  Endocrine: Negative.   Musculoskeletal: Negative.   Skin: Negative.   Neurological: Negative.   Hematological: Negative.   Psychiatric/Behavioral: Negative.   All other systems reviewed and are negative.   BP 118/68 mmHg  Pulse 81  Ht  (1.702 m)  Wt 159 lb (72.122 kg)  BMI 24.90 kg/m2  LMP 02/05/2005  Physical Exam  Constitutional: She is oriented to person, place, and time. She appears well-developed and well-nourished.  HENT:  Head: Normocephalic.  Nose: Nose normal.  Mouth/Throat: Oropharynx is clear and moist.  Eyes: Conjunctivae are normal. Pupils are equal, round, and reactive to light.  Neck: Normal range of motion. Neck supple. No JVD present.  Cardiovascular: Normal rate, regular rhythm, S1 normal, S2 normal, normal heart sounds and intact distal pulses.  Exam reveals no gallop and no friction rub.   No murmur heard. Pulmonary/Chest: Effort normal and breath sounds normal. No respiratory distress. She has no wheezes. She has no rales. She exhibits no tenderness.  Abdominal: Soft. Bowel sounds are normal. She exhibits no distension. There is no tenderness.  Musculoskeletal: Normal range of motion. She exhibits no edema or tenderness.  Lymphadenopathy:    She has no cervical adenopathy.  Neurological: She is alert and oriented to person, place, and time.  Coordination normal.  Skin: Skin is warm and dry. No rash noted. No erythema.  Psychiatric: She has a normal mood and affect. Her behavior is normal. Judgment and thought content normal.    Assessment and Plan  Nursing note and vitals reviewed.

## 2015-05-05 NOTE — Assessment & Plan Note (Signed)
Etiology unclear, symptoms of cramping, occasional loose bowel movements Previously seen by GI

## 2015-05-05 NOTE — Assessment & Plan Note (Signed)
Rare episodes of atrial fibrillation associated with GI distress. Recommended she continue to take flecainide as needed for breakthrough arrhythmia

## 2015-05-05 NOTE — Patient Instructions (Signed)
You are doing well. No medication changes were made.  Please call us if you have new issues that need to be addressed before your next appt.  Your physician wants you to follow-up in: 12 months.  You will receive a reminder letter in the mail two months in advance. If you don't receive a letter, please call our office to schedule the follow-up appointment. 

## 2015-06-14 DIAGNOSIS — W57XXXA Bitten or stung by nonvenomous insect and other nonvenomous arthropods, initial encounter: Secondary | ICD-10-CM | POA: Diagnosis not present

## 2015-06-14 DIAGNOSIS — M47812 Spondylosis without myelopathy or radiculopathy, cervical region: Secondary | ICD-10-CM | POA: Diagnosis not present

## 2015-06-14 DIAGNOSIS — M519 Unspecified thoracic, thoracolumbar and lumbosacral intervertebral disc disorder: Secondary | ICD-10-CM | POA: Diagnosis not present

## 2015-06-14 DIAGNOSIS — S40262A Insect bite (nonvenomous) of left shoulder, initial encounter: Secondary | ICD-10-CM | POA: Diagnosis not present

## 2015-07-06 ENCOUNTER — Other Ambulatory Visit: Payer: Self-pay | Admitting: Obstetrics and Gynecology

## 2015-07-06 DIAGNOSIS — N6489 Other specified disorders of breast: Secondary | ICD-10-CM

## 2015-07-06 DIAGNOSIS — Z1211 Encounter for screening for malignant neoplasm of colon: Secondary | ICD-10-CM | POA: Diagnosis not present

## 2015-07-06 DIAGNOSIS — Z01419 Encounter for gynecological examination (general) (routine) without abnormal findings: Secondary | ICD-10-CM | POA: Diagnosis not present

## 2015-07-06 DIAGNOSIS — N898 Other specified noninflammatory disorders of vagina: Secondary | ICD-10-CM | POA: Diagnosis not present

## 2015-07-06 DIAGNOSIS — R232 Flushing: Secondary | ICD-10-CM | POA: Diagnosis not present

## 2015-07-12 DIAGNOSIS — Z01419 Encounter for gynecological examination (general) (routine) without abnormal findings: Secondary | ICD-10-CM | POA: Diagnosis not present

## 2015-07-12 DIAGNOSIS — R232 Flushing: Secondary | ICD-10-CM | POA: Diagnosis not present

## 2015-08-06 DIAGNOSIS — N3001 Acute cystitis with hematuria: Secondary | ICD-10-CM | POA: Diagnosis not present

## 2015-08-12 ENCOUNTER — Ambulatory Visit: Payer: BLUE CROSS/BLUE SHIELD

## 2015-08-12 ENCOUNTER — Ambulatory Visit
Admission: RE | Admit: 2015-08-12 | Discharge: 2015-08-12 | Disposition: A | Discharge status: Home / Self Care | Payer: BLUE CROSS/BLUE SHIELD | Source: Ambulatory Visit | Attending: Obstetrics and Gynecology | Admitting: Obstetrics and Gynecology

## 2015-08-12 DIAGNOSIS — N6489 Other specified disorders of breast: Secondary | ICD-10-CM | POA: Diagnosis not present

## 2015-08-12 DIAGNOSIS — R928 Other abnormal and inconclusive findings on diagnostic imaging of breast: Secondary | ICD-10-CM | POA: Diagnosis not present

## 2015-09-06 DIAGNOSIS — M722 Plantar fascial fibromatosis: Secondary | ICD-10-CM | POA: Diagnosis not present

## 2015-09-06 DIAGNOSIS — M79671 Pain in right foot: Secondary | ICD-10-CM | POA: Diagnosis not present

## 2015-09-27 DIAGNOSIS — M722 Plantar fascial fibromatosis: Secondary | ICD-10-CM | POA: Diagnosis not present

## 2015-10-13 DIAGNOSIS — J0140 Acute pansinusitis, unspecified: Secondary | ICD-10-CM | POA: Diagnosis not present

## 2015-10-18 DIAGNOSIS — M79671 Pain in right foot: Secondary | ICD-10-CM | POA: Diagnosis not present

## 2015-10-18 DIAGNOSIS — H6982 Other specified disorders of Eustachian tube, left ear: Secondary | ICD-10-CM | POA: Diagnosis not present

## 2015-10-18 DIAGNOSIS — M722 Plantar fascial fibromatosis: Secondary | ICD-10-CM | POA: Diagnosis not present

## 2015-10-18 DIAGNOSIS — J019 Acute sinusitis, unspecified: Secondary | ICD-10-CM | POA: Diagnosis not present

## 2015-11-08 DIAGNOSIS — Z23 Encounter for immunization: Secondary | ICD-10-CM | POA: Diagnosis not present

## 2015-12-12 DIAGNOSIS — N6459 Other signs and symptoms in breast: Secondary | ICD-10-CM | POA: Diagnosis not present

## 2015-12-12 DIAGNOSIS — N760 Acute vaginitis: Secondary | ICD-10-CM | POA: Diagnosis not present

## 2015-12-12 DIAGNOSIS — B9689 Other specified bacterial agents as the cause of diseases classified elsewhere: Secondary | ICD-10-CM | POA: Diagnosis not present

## 2015-12-12 DIAGNOSIS — N898 Other specified noninflammatory disorders of vagina: Secondary | ICD-10-CM | POA: Diagnosis not present

## 2015-12-14 ENCOUNTER — Other Ambulatory Visit: Payer: Self-pay | Admitting: Obstetrics and Gynecology

## 2015-12-14 DIAGNOSIS — N6459 Other signs and symptoms in breast: Secondary | ICD-10-CM

## 2016-01-03 ENCOUNTER — Ambulatory Visit
Admission: RE | Admit: 2016-01-03 | Discharge: 2016-01-03 | Disposition: A | Discharge status: Home / Self Care | Payer: BLUE CROSS/BLUE SHIELD | Source: Ambulatory Visit | Attending: Obstetrics and Gynecology | Admitting: Obstetrics and Gynecology

## 2016-01-03 DIAGNOSIS — R922 Inconclusive mammogram: Secondary | ICD-10-CM | POA: Diagnosis not present

## 2016-01-03 DIAGNOSIS — N6459 Other signs and symptoms in breast: Secondary | ICD-10-CM | POA: Insufficient documentation

## 2016-01-03 DIAGNOSIS — N6489 Other specified disorders of breast: Secondary | ICD-10-CM | POA: Diagnosis not present

## 2016-01-05 DIAGNOSIS — J018 Other acute sinusitis: Secondary | ICD-10-CM | POA: Diagnosis not present

## 2016-01-05 DIAGNOSIS — H66001 Acute suppurative otitis media without spontaneous rupture of ear drum, right ear: Secondary | ICD-10-CM | POA: Diagnosis not present

## 2016-01-16 DIAGNOSIS — N3001 Acute cystitis with hematuria: Secondary | ICD-10-CM | POA: Diagnosis not present

## 2016-01-25 DIAGNOSIS — N760 Acute vaginitis: Secondary | ICD-10-CM | POA: Diagnosis not present

## 2016-01-25 DIAGNOSIS — B9689 Other specified bacterial agents as the cause of diseases classified elsewhere: Secondary | ICD-10-CM | POA: Diagnosis not present

## 2016-01-25 DIAGNOSIS — B379 Candidiasis, unspecified: Secondary | ICD-10-CM | POA: Diagnosis not present

## 2016-02-08 DIAGNOSIS — J301 Allergic rhinitis due to pollen: Secondary | ICD-10-CM | POA: Diagnosis not present

## 2016-02-17 DIAGNOSIS — Z8709 Personal history of other diseases of the respiratory system: Secondary | ICD-10-CM | POA: Diagnosis not present

## 2016-02-17 DIAGNOSIS — J00 Acute nasopharyngitis [common cold]: Secondary | ICD-10-CM | POA: Diagnosis not present

## 2016-02-17 DIAGNOSIS — R05 Cough: Secondary | ICD-10-CM | POA: Diagnosis not present

## 2016-06-09 DIAGNOSIS — N3000 Acute cystitis without hematuria: Secondary | ICD-10-CM | POA: Diagnosis not present

## 2016-07-10 ENCOUNTER — Other Ambulatory Visit: Payer: Self-pay | Admitting: Obstetrics and Gynecology

## 2016-07-10 DIAGNOSIS — Z1231 Encounter for screening mammogram for malignant neoplasm of breast: Secondary | ICD-10-CM

## 2016-07-10 DIAGNOSIS — Z01419 Encounter for gynecological examination (general) (routine) without abnormal findings: Secondary | ICD-10-CM | POA: Diagnosis not present

## 2016-07-10 DIAGNOSIS — N898 Other specified noninflammatory disorders of vagina: Secondary | ICD-10-CM | POA: Diagnosis not present

## 2016-07-10 DIAGNOSIS — Z1211 Encounter for screening for malignant neoplasm of colon: Secondary | ICD-10-CM | POA: Diagnosis not present

## 2016-08-02 ENCOUNTER — Encounter: Payer: Self-pay | Admitting: Cardiovascular Disease

## 2016-08-14 ENCOUNTER — Ambulatory Visit
Admission: RE | Admit: 2016-08-14 | Discharge: 2016-08-14 | Disposition: A | Discharge status: Home / Self Care | Payer: BLUE CROSS/BLUE SHIELD | Source: Ambulatory Visit | Attending: Obstetrics and Gynecology | Admitting: Obstetrics and Gynecology

## 2016-08-14 DIAGNOSIS — Z1231 Encounter for screening mammogram for malignant neoplasm of breast: Secondary | ICD-10-CM

## 2016-10-30 DIAGNOSIS — R202 Paresthesia of skin: Secondary | ICD-10-CM | POA: Diagnosis not present

## 2016-10-30 DIAGNOSIS — R200 Anesthesia of skin: Secondary | ICD-10-CM | POA: Diagnosis not present

## 2016-10-30 DIAGNOSIS — R5382 Chronic fatigue, unspecified: Secondary | ICD-10-CM | POA: Diagnosis not present

## 2016-11-21 DIAGNOSIS — Z23 Encounter for immunization: Secondary | ICD-10-CM | POA: Diagnosis not present

## 2016-12-06 DIAGNOSIS — M519 Unspecified thoracic, thoracolumbar and lumbosacral intervertebral disc disorder: Secondary | ICD-10-CM | POA: Diagnosis not present

## 2016-12-06 DIAGNOSIS — M47812 Spondylosis without myelopathy or radiculopathy, cervical region: Secondary | ICD-10-CM | POA: Diagnosis not present

## 2016-12-07 DIAGNOSIS — M1990 Unspecified osteoarthritis, unspecified site: Secondary | ICD-10-CM | POA: Insufficient documentation

## 2016-12-07 DIAGNOSIS — M5137 Other intervertebral disc degeneration, lumbosacral region: Secondary | ICD-10-CM | POA: Insufficient documentation

## 2016-12-08 NOTE — Progress Notes (Signed)
Cardiology Office Note  Date:  12/11/2016   ID:  Kayla Castro, DOB 06/27/1970, MRN [id number]  PCP:  Kayla RumpleVirk, Charanjit, MD   Chief Complaint  Patient presents with  . other    1 year follow up. Meds reviewed by the pt. verbally. "doing well."     HPI:  Kayla Castro  Is a pleasant 46 year old woman with history of  Asthma, anemia,  paroxysmal atrial fibrillation , Symptoms typically start after GI distress who presents for routine follow-up of her arrhythmia.   Off flecainide, Taking potassium regularly Without potassium she has leg cramping  No severe GI disorder recently Denies any significant palpitations concerning for atrial fibrillation  Lab work reviewed with her in detail, no recent basic metabolic panel since 2017  EKG on today's visit shows normal sinus rhythm with rate 65 bpm, no significant ST or T-wave changes  Other past medical history She presented to [facility] 02/25/14 with abdominal pain, diarrhea, EKG showing atrial fibrillation with heart rate 160 bpm, also with self-reported shortness of breath for several weeks prior to admission, potassium 3.7 on arrival, negative cardiac enzymes, essentially normal TSH, echocardiogram showing normal ejection fraction, normal right ventricular systolic pressure,   converting to normal sinus rhythm with heart rates in the 70s, discharged home   She does report a long history of periodic IBS/colitis symptoms.  History of asthma and takes inhalers periodically  EKG from the hospital showed atrial fibrillation with ventricular rate 164 bpm Echocardiogram reviewed with her 02/25/2014 showing normal ejection fraction, essentially normal study Total cholesterol 185, LDL 111 Prior stress test November 2009 showing no ischemia    PMH:   has a past medical history of Anemia, Asthma, Asthma, mild intermittent (03/04/2014), Atrial fibrillation (HCC), Colon polyp (2014), Epigastric pain (05/14/2014), GERD (gastroesophageal reflux disease),  History of kidney stones, New onset a-fib (HCC) (2016), Palpitations, and Shortness of breath (03/04/2014).  PSH:    Past Surgical History:  Procedure Laterality Date  . ABDOMINAL HYSTERECTOMY  2007  . CESAREAN SECTION     x 2  . COLONOSCOPY  2014   Dr Bluford Kaufmannh  . scar tissue excision x6  2004, 2006   x6  . UPPER GI ENDOSCOPY  2014, 2016   Dr. Bluford Kaufmannh    Current Outpatient Medications  Medication Sig Dispense Refill  . albuterol (PROVENTIL HFA;VENTOLIN HFA) 108 (90 BASE) MCG/ACT inhaler Inhale 2 puffs into the lungs every 6 (six) hours as needed.     . flecainide (TAMBOCOR) 50 MG tablet Take 1 tablet (50 mg total) by mouth 2 (two) times daily as needed. 60 tablet 3  . Multiple Vitamin (MULTIVITAMIN) capsule Take 1 capsule by mouth daily. Reported on 05/05/2015    . omeprazole (PRILOSEC) 40 MG capsule Take 40 mg daily by mouth.     . Potassium 95 MG TABS Take daily by mouth.    . predniSONE (DELTASONE) 5 MG tablet Take 5 mg as directed by mouth.     No current facility-administered medications for this visit.      Allergies:   Sulfa antibiotics   Social History:  The patient  reports that  has never smoked. she has never used smokeless tobacco. She reports that she does not drink alcohol or use drugs.   Family History:   family history includes CVA in her mother; Heart attack (age of onset: 5850) in her father; Heart disease in her father; Hypertension in her mother.    Review of Systems: Review of Systems  Constitutional: Negative.  Respiratory: Negative.   Cardiovascular: Negative.   Gastrointestinal: Negative.   Musculoskeletal: Negative.   Neurological: Negative.   Psychiatric/Behavioral: Negative.   All other systems reviewed and are negative.    PHYSICAL EXAM: VS:  BP 100/64 (BP Location: Left Arm, Patient Position: Sitting, Cuff Size: Normal)   Ht 5\' 7"  (1.702 m)   Wt 164 lb 8 oz (74.6 kg)   LMP 02/05/2005   BMI 25.76 kg/m  , BMI Body mass index is 25.76 kg/m. GEN:  Well nourished, well developed, in no acute distress  HEENT: normal  Neck: no JVD, carotid bruits, or masses Cardiac: RRR; no murmurs, rubs, or gallops,no edema  Respiratory:  clear to auscultation bilaterally, normal work of breathing GI: soft, nontender, nondistended, + BS MS: no deformity or atrophy  Skin: warm and dry, no rash Neuro:  Strength and sensation are intact Psych: euthymic mood, full affect    Recent Labs: No results found for requested labs within last 8760 hours.    Lipid Panel Lab Results  Component Value Date   CHOL 185 02/25/2014   HDL 62 (H) 02/25/2014   LDLCALC 111 (H) 02/25/2014   TRIG 60 02/25/2014      Wt Readings from Last 3 Encounters:  12/11/16 164 lb 8 oz (74.6 kg)  05/05/15 159 lb (72.1 kg)  10/21/14 150 lb (68 kg)       ASSESSMENT AND PLAN:  Paroxysmal atrial fibrillation (HCC) - Plan: EKG 12-Lead No recent symptoms concerning for arrhythmia She takes potassium daily Not on flecainide on a regular basis  Shortness of breath - Plan: EKG 12-Lead Feels well, denies having significant shortness of breath No further workup at this time  Disposition:   F/U 12 months as needed   Total encounter time more than 15 minutes  Greater than 50% was spent in counseling and coordination of care with the patient    Orders Placed This Encounter  Procedures  . EKG 12-Lead     Signed, Dossie Arbour, M.D., Ph.D. 12/11/2016  [facility] [place], [place] [phone]

## 2016-12-11 ENCOUNTER — Ambulatory Visit: Payer: BLUE CROSS/BLUE SHIELD | Admitting: Cardiovascular Disease

## 2016-12-11 ENCOUNTER — Encounter: Payer: Self-pay | Admitting: Cardiovascular Disease

## 2016-12-11 VITALS — BP 100/64 | Ht 67.0 in | Wt 164.5 lb

## 2016-12-11 DIAGNOSIS — R0602 Shortness of breath: Secondary | ICD-10-CM

## 2016-12-11 DIAGNOSIS — M47812 Spondylosis without myelopathy or radiculopathy, cervical region: Secondary | ICD-10-CM | POA: Diagnosis not present

## 2016-12-11 DIAGNOSIS — I480 Paroxysmal atrial fibrillation: Secondary | ICD-10-CM

## 2016-12-11 NOTE — Patient Instructions (Addendum)

## 2016-12-18 DIAGNOSIS — M47812 Spondylosis without myelopathy or radiculopathy, cervical region: Secondary | ICD-10-CM | POA: Diagnosis not present

## 2017-01-02 DIAGNOSIS — R300 Dysuria: Secondary | ICD-10-CM | POA: Diagnosis not present

## 2017-01-08 DIAGNOSIS — M47812 Spondylosis without myelopathy or radiculopathy, cervical region: Secondary | ICD-10-CM | POA: Diagnosis not present

## 2017-01-08 DIAGNOSIS — M5136 Other intervertebral disc degeneration, lumbar region: Secondary | ICD-10-CM | POA: Diagnosis not present

## 2017-01-08 DIAGNOSIS — M79642 Pain in left hand: Secondary | ICD-10-CM | POA: Diagnosis not present

## 2017-01-08 DIAGNOSIS — M545 Low back pain, unspecified: Secondary | ICD-10-CM | POA: Insufficient documentation

## 2017-01-08 DIAGNOSIS — M549 Dorsalgia, unspecified: Secondary | ICD-10-CM | POA: Diagnosis not present

## 2017-01-08 DIAGNOSIS — G8929 Other chronic pain: Secondary | ICD-10-CM | POA: Diagnosis not present

## 2017-01-08 DIAGNOSIS — M4807 Spinal stenosis, lumbosacral region: Secondary | ICD-10-CM | POA: Diagnosis not present

## 2017-01-08 DIAGNOSIS — M79641 Pain in right hand: Secondary | ICD-10-CM | POA: Diagnosis not present

## 2017-02-06 DIAGNOSIS — M5136 Other intervertebral disc degeneration, lumbar region: Secondary | ICD-10-CM | POA: Diagnosis not present

## 2017-02-06 DIAGNOSIS — M1990 Unspecified osteoarthritis, unspecified site: Secondary | ICD-10-CM | POA: Diagnosis not present

## 2017-02-06 DIAGNOSIS — M47812 Spondylosis without myelopathy or radiculopathy, cervical region: Secondary | ICD-10-CM | POA: Diagnosis not present

## 2017-05-14 DIAGNOSIS — M47812 Spondylosis without myelopathy or radiculopathy, cervical region: Secondary | ICD-10-CM | POA: Diagnosis not present

## 2017-05-14 DIAGNOSIS — M5136 Other intervertebral disc degeneration, lumbar region: Secondary | ICD-10-CM | POA: Diagnosis not present

## 2017-05-14 IMAGING — MG MM DIGITAL DIAGNOSTIC UNILAT*R* W/ TOMO W/ CAD
9 series · 9 of 21 positions shown · non-contrast
Comparison: Previous exam(s).

CLINICAL DATA: The patient's referring physician noted possible
thickening within the inferior right breast at the 6 o'clock
position.

EXAM:
2D DIGITAL DIAGNOSTIC RIGHT MAMMOGRAM WITH ADJUNCT TOMO
ULTRASOUND RIGHT BREAST

[R TAN]
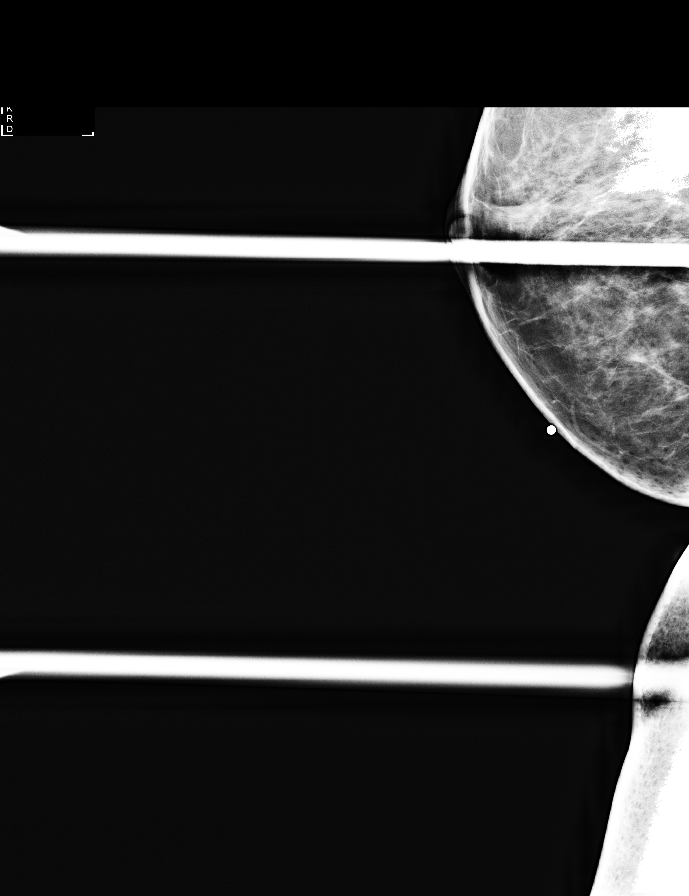

[R MLO]
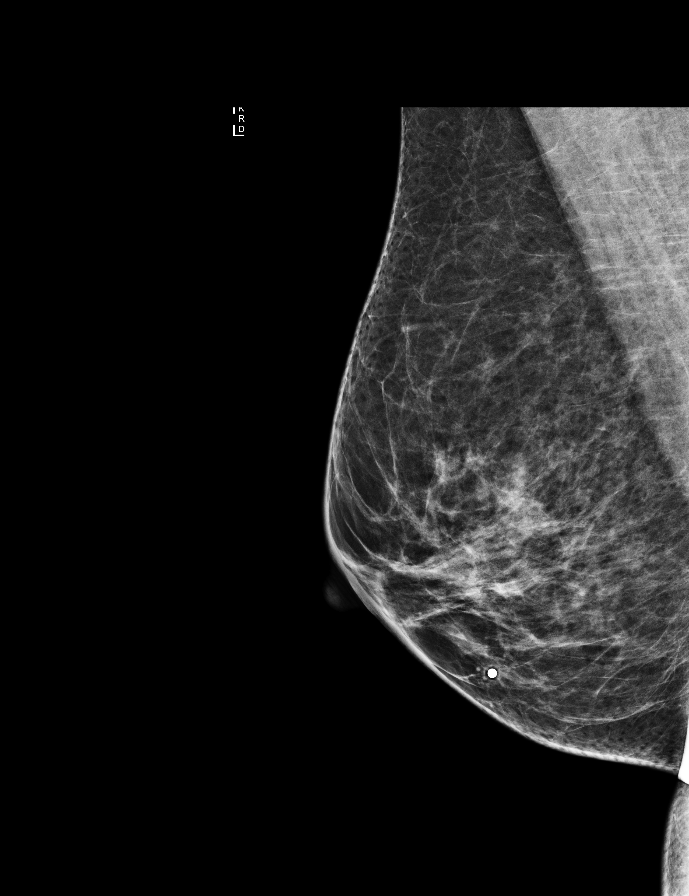

[R CC]
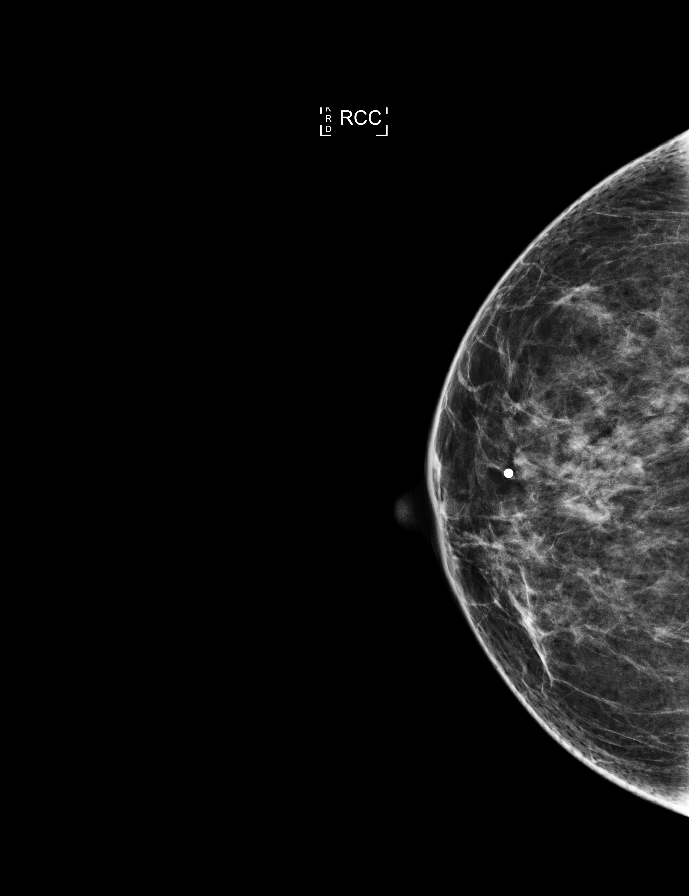

[R MLO synth-2D]
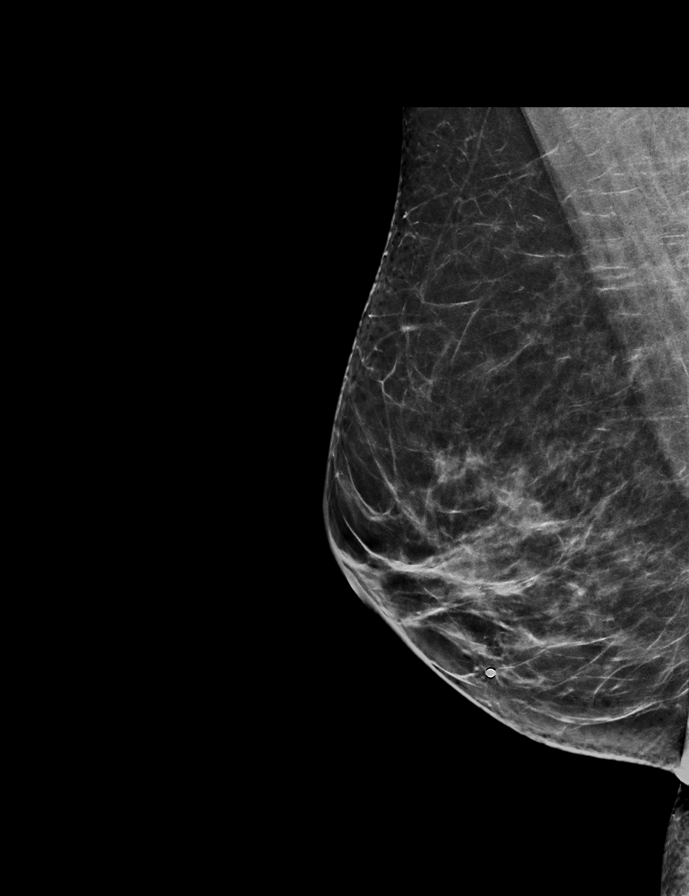

[R CC synth-2D]
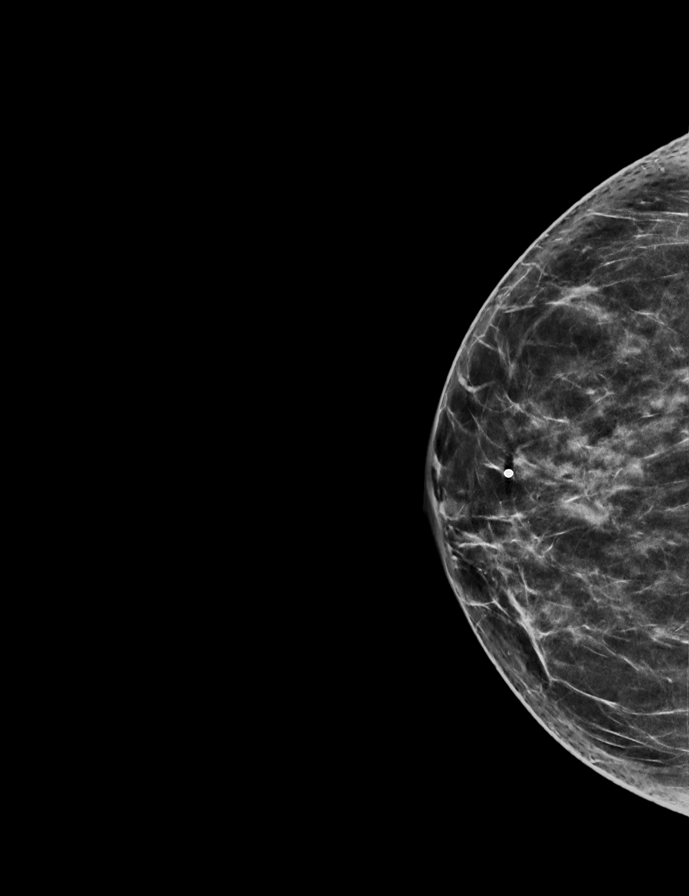

[R TAN synth-2D]
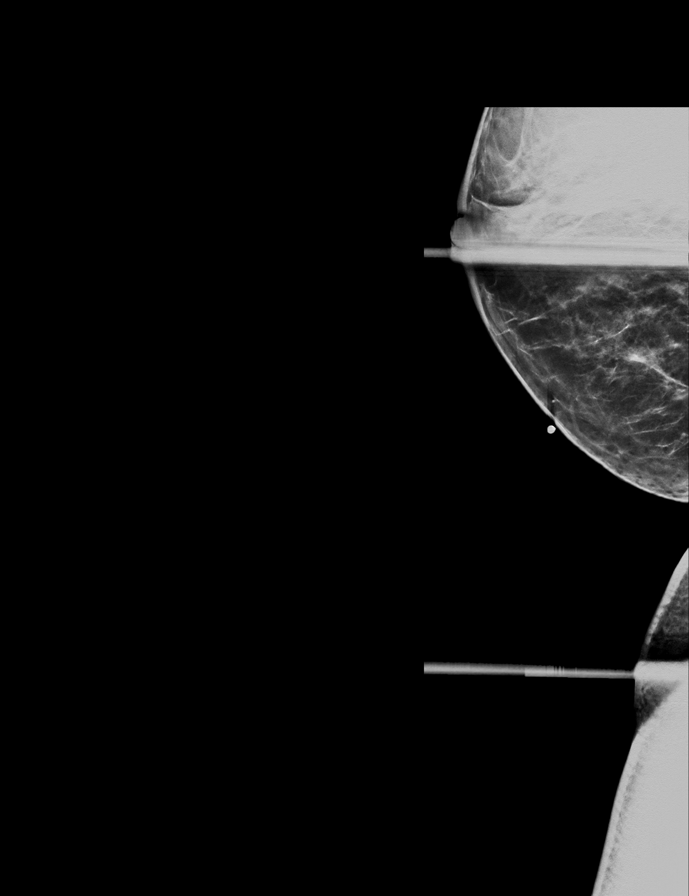

[R MLO tomo · tomo slice 35/69.0]
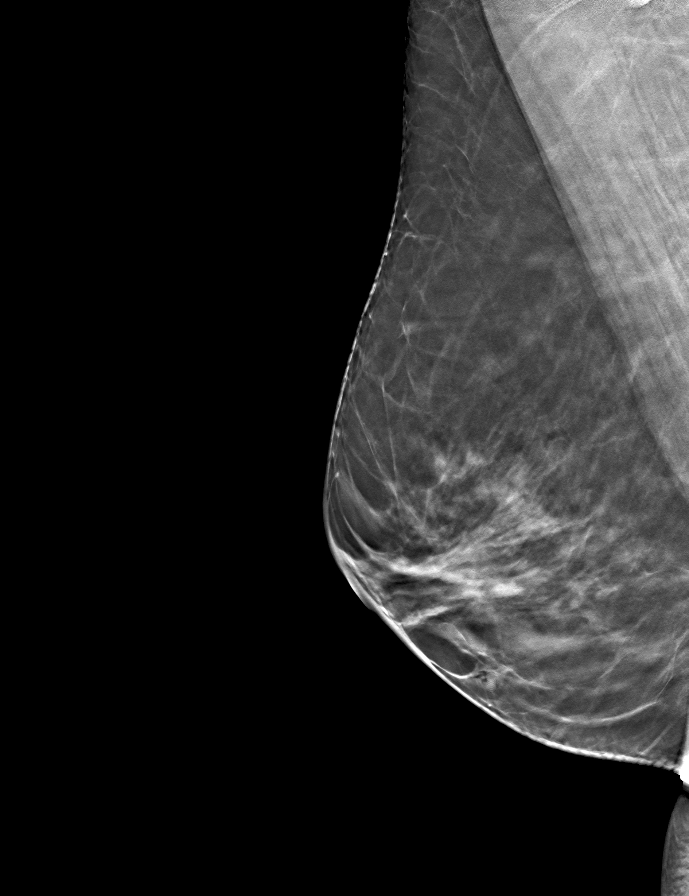

[R TAN tomo · tomo slice 23/46.0]
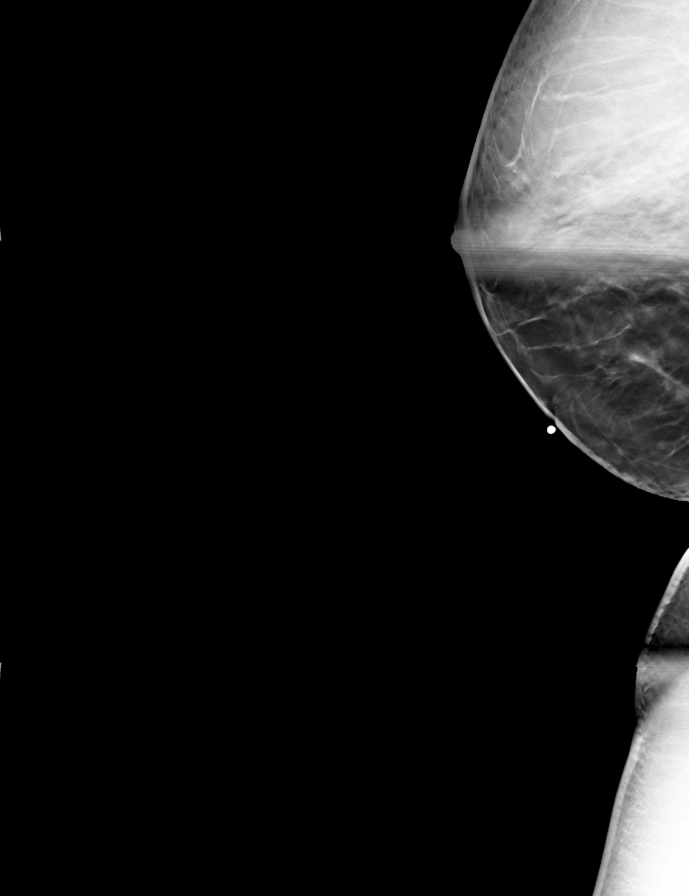

[R CC tomo · tomo slice 32/63.0]
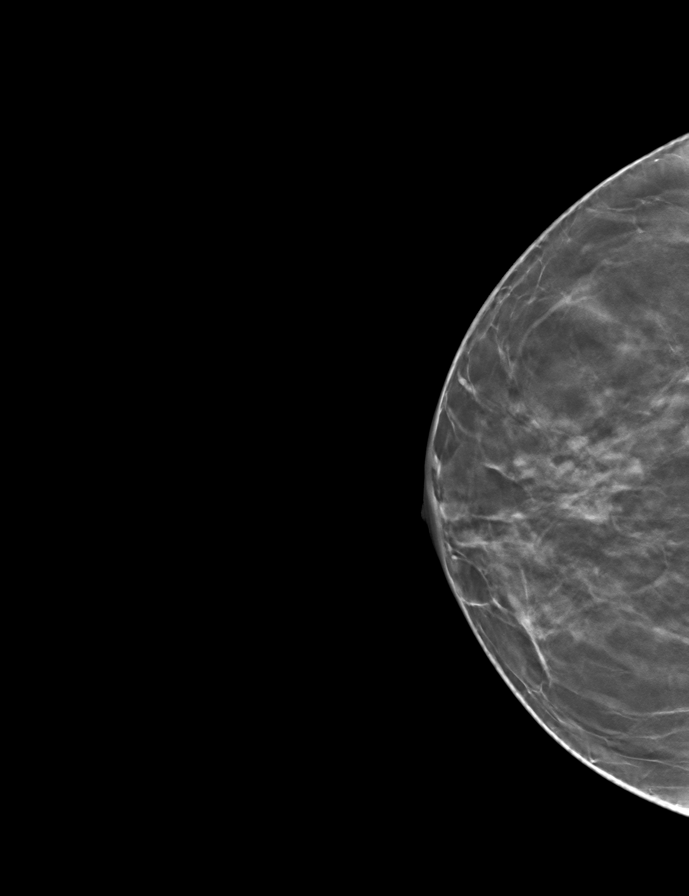

[9 of 21 positions shown; findings below may reference images not displayed]

ACR Breast Density Category c: The breast tissue is heterogeneously
dense, which may obscure small masses.
FINDINGS: There is no mass, distortion, or worrisome calcification. The right
breast parenchymal pattern is stable.

On physical exam, there is no discrete palpable abnormality within
the inferior right breast on today's examination.

Targeted ultrasound is performed, showing normal appearing
fibroglandular and fibro fatty tissue within the inferior right
breast.
IMPRESSION: No findings worrisome for malignancy. Recommend bilateral screening
mammography in August 2016.

RECOMMENDATION:
Bilateral screening mammography in August 2016.

I have discussed the findings and recommendations with the patient.
Results were also provided in writing at the conclusion of the
visit. If applicable, a reminder letter will be sent to the patient
regarding the next appointment.

BI-RADS CATEGORY  1: Negative.

## 2017-05-20 ENCOUNTER — Encounter: Payer: Self-pay | Admitting: Physical Therapy

## 2017-05-20 ENCOUNTER — Ambulatory Visit: Payer: BLUE CROSS/BLUE SHIELD | Attending: Internal Medicine | Admitting: Physical Therapy

## 2017-05-20 DIAGNOSIS — M545 Low back pain, unspecified: Secondary | ICD-10-CM

## 2017-05-20 NOTE — Therapy (Signed)
[facility] [address] [place], [address] Phone: [phone]   Fax:  [phone]  Physical Therapy Evaluation  Patient Details  Name: Kayla Castro MRN: [id number] Date of Birth: 08-01-1970 Referring Provider: Renard Matter MD   Encounter Date: 05/20/2017  PT End of Session - 05/20/17 1007    Visit Number  1    Number of Visits  13    Date for PT Re-Evaluation  07/01/17    PT Start Time  0900    PT Stop Time  0955    PT Time Calculation (min)  55 min    Activity Tolerance  Patient tolerated treatment well    Behavior During Therapy  [facility] for tasks assessed/performed       Past Medical History:  Diagnosis Date  . Anemia   . Asthma    Dr. Mayo Ao  . Asthma, mild intermittent 03/04/2014  . Atrial fibrillation (HCC)   . Colon polyp 2014  . Epigastric pain 05/14/2014  . GERD (gastroesophageal reflux disease)   . History of kidney stones   . New onset a-fib [facility]) 2016   Dr. Mariah Milling  . Palpitations   . Shortness of breath 03/04/2014    Past Surgical History:  Procedure Laterality Date  . ABDOMINAL HYSTERECTOMY  2007  . CESAREAN SECTION     x 2  . COLONOSCOPY  2014   Dr Bluford Kaufmann  . ESOPHAGOGASTRODUODENOSCOPY (EGD) WITH PROPOFOL N/A 10/08/2014   Procedure: ESOPHAGOGASTRODUODENOSCOPY (EGD) WITH PROPOFOL;  Surgeon: Wallace Cullens, MD;  Location: [facility] ENDOSCOPY;  Service: Gastroenterology;  Laterality: N/A;  . scar tissue excision x6  2004, 2006   x6  . UPPER GI ENDOSCOPY  2014, 2016   Dr. Bluford Kaufmann    There were no vitals filed for this visit.   Subjective Assessment - 05/20/17 0911    Pertinent History  Pt is 47 year old female with low back pain that she reports has gradually gotten worse over the past 2 weeks. Patient reports her pain does not radiate down the LE's and stays localized to the low back/tailbone area. Patient reports pain dull and feels like a deep bruise. Patient reports worst pain over the past week is  4/10 and best 0/10 pain at rest  Patient owns a cleaning business and pain is currently inhibiting her from being able to complete her job duties.     Limitations  Sitting;Lifting;Standing;Walking;House hold activities    How long can you sit comfortably?  10 mins    How long can you stand comfortably?  30 mins    How long can you walk comfortably?  30 mins    Diagnostic tests  X Rays: lumbar degenerative arthritis, degeneration of intervertebral disc at L5-S1, cervical spondylosis without myelopathy    Patient Stated Goals  Reduce pain to be able to complete work activity, and to accompany son to ride to [place]    Currently in Pain?  Yes    Pain Location  Back    Pain Orientation  Mid;Posterior    Pain Descriptors / Indicators  Dull    Pain Type  Acute pain    Pain Radiating Towards  Does not radiate    Pain Onset  1 to 4 weeks ago    Pain Frequency  Intermittent    Aggravating Factors   Walking, prolonged sitting, bending over     Pain Relieving Factors  tried ice/heat/medication with little relief; reports TENS unit helps when  she is driving in the car    Effect of Pain on Daily Activities  Unable to ride in car for long periods and complete work duties (owns Education officer, environmental business)         [facility] PT Assessment - 05/20/17 0001      Assessment   Medical Diagnosis  lumbar arthritis     Referring Provider  Renard Matter MD    Onset Date/Surgical Date  04/30/17    Hand Dominance  Right    Next MD Visit  Jun 06, 2017    Prior Therapy  Yes; successful      Balance Screen   Has the patient fallen in the past 6 months  No    Has the patient had a decrease in activity level because of a fear of falling?   No    Is the patient reluctant to leave their home because of a fear of falling?   No      Prior Function   Level of Independence  Independent    Vocation  -- Owns cleaning business    Vocation Requirements  -- Prolonged standing/sitting, pushing/pulling, bending/stoopin    Leisure  --  Enjoying and helping setting up childs baseball games         ROM Lumbar flexion limited with "pulling sensation" in hamstrings but no pain Lumbar ext slightly limited with pain with repeated ext Lumbar rotation: R limited and "sore" Lumbar lateral bending wnl bilat  Hip motions wnl EXCEPT slight limitation in bilat IR  Strength Hip flex: 5/5 Hip abd  4/5 bilat Hip Ext in prone:4/5 bilat Hip IR: 4+/5 bilat Hip ER: 4/5 bilat     Special Tests/ Other Reflexes 2+ bilat patella and achilles  (+) slump on L (-) on R (-) SLR (-) Crossed SLR (+) Elys on L for hip flexor tightness Pain exacerbated with CPA to L5-S1 and further exacerbated with prone prop; UPA on R > sore than UPA on L (-) 90/90 bilat (-) FABER bilat (+) Obers bilat (-) Scour bilat (-) SI cluster Tightness at R lower lumbar paraspinals > L 5xSTS 9sec with "some pain"  Gait: patient ambulates with R lateral trunk lean with slight R hip elevation and R shoulder drop  Ther-Ex -L Lateral childs pose 3x 30sec holds -Seated/standing lumbar flex 1x 30sec -Education on proper squat form with lifting with demonstration and return demonstration from patient           Objective measurements completed on examination: See above findings.              PT Education - 05/20/17 1007    Education provided  Yes    Education Details  Patient was educated on diagnosis, anatomy and pathology involved, prognosis, role of PT, and was given an HEP, demonstrating exercise with proper form following verbal and tactile cues, and was given a paper hand out to continue exercise at home. Pt was educated on and agreed to plan of care.    Person(s) Educated  Patient    Methods  Explanation;Demonstration;Tactile cues;Verbal cues;Handout    Comprehension  Verbal cues required;Tactile cues required;Verbalized understanding;Returned demonstration       PT Short Term Goals - 05/20/17 1011      PT SHORT TERM GOAL #1    Title  Pt will be independent with HEP in order to improve strength and balance in order to decrease fall risk and improve function at home and work.    Time  2  Period  Weeks    Status  New        PT Long Term Goals - 05/20/17 1014      PT LONG TERM GOAL #1   Title   Pt will decrease worst pain as reported on NPRS by at least 3 points in order to demonstrate clinically significant reduction in pain.     Baseline  4/15 worst 4/10    Time  6    Period  Weeks    Status  New      PT LONG TERM GOAL #2   Title  Patient will increase FOTO score to 71 to demonstrate predicted increase in functional mobility to complete ADLs    Baseline  4/15 53    Time  6    Period  Weeks    Status  New      PT LONG TERM GOAL #3   Title   Pt will decrease mODI scoreby at least 13 points in order demonstrate clinically significant reduction in pain/disability    Baseline  4/15 42%    Time  6    Period  Weeks    Status  New             Plan - 05/20/17 1018    Clinical Impression Statement   Pt is a 47 year old female with signs and symptoms of R lumbar paraspinal strain that has gradually worsened over the past 2 weeks. Current activity limitations in prolonged sitting, moving from sitting to standing, and lifting heavy items. Impairments including low back pain area, soft tissue restrictions in R lumbar paraspinals, and restrictions in lumbar and hip motion. 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    History and Personal Factors relevant to plan of care:  2 personal factors/comorbidities, 3 body systems/activity limitations/participation restrictions     Clinical Presentation  Evolving    Clinical Presentation due to:  Muliptle pain sites    Clinical Decision Making  Moderate    Rehab Potential  Good    Clinical Impairments Affecting Rehab Potential   (+) young age, active job, motivation (-) sedentary lifestyle, multiple pain sites,     PT Frequency  2x / week    PT Duration  6 weeks    PT Treatment/Interventions  Gait training;Neuromuscular re-education;Passive range of motion;Dry needling;Manual techniques;Energy conservation;Balance training;Therapeutic exercise;Therapeutic activities;Patient/family education;Functional mobility training;Stair training;Moist Heat;Traction;Ultrasound;Electrical Stimulation;Cryotherapy;Aquatic Therapy;ADLs/Self Care Home Management    PT Next Visit Plan  HEP review; manual and modalities to soft tissue restrictions; proper body mechanics with work duties; gait training    PT Home Exercise Plan  normalizing gait, L lateral child's pose, lumbar flexion stretch, tennis ball massage    Consulted and Agree with Plan of Care  Patient       Patient will benefit from skilled therapeutic intervention in order to improve the following deficits and impairments:  Abnormal gait, Decreased mobility, Postural dysfunction, Increased fascial restricitons, Improper body mechanics, Decreased activity tolerance, Decreased endurance, Decreased strength, Decreased balance, Difficulty walking, Impaired flexibility  Visit Diagnosis: Acute right-sided low back pain without sciatica     Problem List Patient Active Problem List   Diagnosis Date Noted  . Paroxysmal atrial fibrillation (HCC) 05/05/2015  . Epigastric pain 05/14/2014  . Palpitations 03/04/2014  . New onset a-fib (HCC) 03/04/2014  . Shortness of breath 03/04/2014  . Asthma, mild intermittent 03/04/2014  .  Epigastric abdominal pain 03/04/2014   Staci Acostahelsea Miller PT, DPT Staci Acostahelsea Miller 05/20/2017, 10:28 AM  [facility] [address] [place], [place], [address] Phone: [phone]   Fax:  [phone]  Name: Kristin Bruinsonya M Hirata MRN: [id number] Date of Birth: 04/17/1970

## 2017-05-23 ENCOUNTER — Ambulatory Visit: Payer: BLUE CROSS/BLUE SHIELD

## 2017-05-27 ENCOUNTER — Encounter: Payer: Self-pay | Admitting: Physical Therapy

## 2017-05-27 ENCOUNTER — Ambulatory Visit: Payer: BLUE CROSS/BLUE SHIELD | Admitting: Physical Therapy

## 2017-05-27 DIAGNOSIS — M545 Low back pain, unspecified: Secondary | ICD-10-CM

## 2017-05-27 NOTE — Therapy (Signed)
[place] [facility] [address], [place], [address] Phone: [phone]   Fax:  [phone]  Physical Therapy Treatment  Patient Details  Name: Kayla Castro MRN: [id number] Date of Birth: 17-Dec-1970 Referring Provider: Renard Matter MD   Encounter Date: 05/27/2017  PT End of Session - 05/27/17 0927    Visit Number  2    Number of Visits  13    Date for PT Re-Evaluation  07/01/17    PT Start Time  0900    PT Stop Time  0945    PT Time Calculation (min)  45 min    Activity Tolerance  Patient tolerated treatment well    Behavior During Therapy  [facility] for tasks assessed/performed       Past Medical History:  Diagnosis Date  . Anemia   . Asthma    Dr. Mayo Ao  . Asthma, mild intermittent 03/04/2014  . Atrial fibrillation (HCC)   . Colon polyp 2014  . Epigastric pain 05/14/2014  . GERD (gastroesophageal reflux disease)   . History of kidney stones   . New onset a-fib [facility]) 2016   Dr. Mariah Milling  . Palpitations   . Shortness of breath 03/04/2014    Past Surgical History:  Procedure Laterality Date  . ABDOMINAL HYSTERECTOMY  2007  . CESAREAN SECTION     x 2  . COLONOSCOPY  2014   Dr Bluford Kaufmann  . ESOPHAGOGASTRODUODENOSCOPY (EGD) WITH PROPOFOL N/A 10/08/2014   Procedure: ESOPHAGOGASTRODUODENOSCOPY (EGD) WITH PROPOFOL;  Surgeon: Wallace Cullens, MD;  Location: [facility]COPY;  Service: Gastroenterology;  Laterality: N/A;  . scar tissue excision x6  2004, 2006   x6  . UPPER GI ENDOSCOPY  2014, 2016   Dr. Bluford Kaufmann    There were no vitals filed for this visit.  Subjective Assessment - 05/27/17 0901    Subjective  Pt reports she took herself off meloxicam and has noticed her tail bone pain has subsided. She reports her pain over the weekend was only 3/10 in the LB after prolonged sitting. Pt reports deligence with her HEP with no increased pain.     Pertinent History  Pt is 47 year old female with low back pain that she reports has gradually  gotten worse over the past 2 weeks. Patient reports her pain does not radiate down the LE's and stays localized to the low back/tailbone area. Patient reports pain dull and feels like a deep bruise. 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     Limitations  Sitting;Lifting;Standing;Walking;House hold activities    How long can you sit comfortably?  10 mins    How long can you stand comfortably?  30 mins    How long can you walk comfortably?  30 mins    Diagnostic tests  X Rays: lumbar degenerative arthritis, degeneration of intervertebral disc at L5-S1, cervical spondylosis without myelopathy    Patient Stated Goals  Reduce pain to be able to complete work activity, and to accompany son to ride to [place]    Pain Onset  1 to 4 weeks ago         Ther-Ex -L Lateral child's pose 3x 30sec holds with min cuing needed -Lumbar flexion stretch standing 3x 30sec holds with cuing to prevent bilat knee hyperext -Posterior pelvic tilts 20x w/  3-5 sec holds with min VC and TC needed initially for proper core contraction -Posterior pelvic tilt with alt march 3x 10e with VC throughout to maintain post pelvic tilt -Dead Bug exercise 2x 5e with cuing to reduce LE lowering to maintain core contraction  Manual -STM + trigger point release to R lumbar paraspinals with 75% tension release noted -GIII-IV L2-5 L UPA mobs 30sec bouts 4 bouts each segment for increased R rotation   Gait Training PT utilized mirror visual cue to demonstrate gait abnormalities. Patient ambulates with L shoulder hiking and slight R rotation and lateral bending. PT used mirror to elicit normalized gait pattern from patient and educated patient on gait imbalance and this effect on muscular imbalances. PT also encouraged patient to carry cleaning supplies on R side as patient  reports she normally carries them on the L and compensates with L lateral bend + rotation to hold them.     ESTIM + heat pack [employer] ESTIM 15 min at patient tolerated 145V increased to 120V through treatment at R lumbar paraspinals . PT educated patient on goal of modality to decrease muscle spasm and tightness. With PT assessing patient tolerance throughout (decreasing intensity as needed), monitoring skin integrity (normal), with decreased pain noted from patient                       PT Education - 05/27/17 0925    Education provided  Yes    Education Details  Exercise form; gait training; modality education    Person(s) Educated  Patient    Methods  Explanation;Demonstration;Tactile cues;Verbal cues    Comprehension  Verbalized understanding;Returned demonstration;Verbal cues required;Tactile cues required       PT Short Term Goals - 05/20/17 1011      PT SHORT TERM GOAL #1   Title  Pt will be independent with HEP in order to improve strength and balance in order to decrease fall risk and improve function at home and work.    Time  2    Period  Weeks    Status  New        PT Long Term Goals - 05/20/17 1014      PT LONG TERM GOAL #1   Title   Pt will decrease worst pain as reported on NPRS by at least 3 points in order to demonstrate clinically significant reduction in pain.     Baseline  4/15 worst 4/10    Time  6    Period  Weeks    Status  New      PT LONG TERM GOAL #2   Title  Patient will increase FOTO score to 71 to demonstrate predicted increase in functional mobility to complete ADLs    Baseline  4/15 53    Time  6    Period  Weeks    Status  New      PT LONG TERM GOAL #3   Title   Pt will decrease mODI scoreby at least 13 points in order demonstrate clinically significant reduction in pain/disability    Baseline  4/15 42%    Time  6    Period  Weeks    Status  New            Plan - 05/27/17 [id number]    Clinical Impression Statement   Patient tolerated all treatment with no increased pain. Through manual techniques PT was able to relief tension in R lumbar paraspinals and patient reported decreased  pain. Patient was able to properly activate core musculature with min cuing needed for PT. PT educated patient on importance of core strengthening to prevent post/ant muscle imbalances, patient verbalized understanding. PT utilized mirror visual feedback to demonstrate abnormal gait pattern to patient. Patient ambulates with L shoulder hiking and R lateral trunk lean and rotation. PT educated patient how this imbalance can further contract lumbar paraspinals and encouraged patient to carry cleaning supplies on R side to prevent this postural compensation to hold heavy cleaning items. Patient verbalized understanding. Patient was educated on modality use, and ESTIM + heat pack were used at the end of session. At the end of session patient demonstrates full lumbar rotation bilat and reports no pain.     Clinical Impairments Affecting Rehab Potential  (+) young age, active job, motivation (-) sedentary lifestyle, multiple pain sites,     PT Frequency  2x / week    PT Duration  6 weeks    PT Treatment/Interventions  Gait training;Neuromuscular re-education;Passive range of motion;Dry needling;Manual techniques;Energy conservation;Balance training;Therapeutic exercise;Therapeutic activities;Patient/family education;Functional mobility training;Stair training;Moist Heat;Traction;Ultrasound;Electrical Stimulation;Cryotherapy;Aquatic Therapy;ADLs/Self Care Home Management    PT Next Visit Plan  core strengthening; manual and modalities to soft tissue restrictions; proper body mechanics with work duties; gait training    PT Home Exercise Plan  normalizing gait, L lateral child's pose, lumbar flexion stretch, tennis ball massage    Consulted and Agree with Plan of Care  Patient       Patient will benefit from skilled therapeutic intervention in order  to improve the following deficits and impairments:  Abnormal gait, Decreased mobility, Postural dysfunction, Increased fascial restricitons, Improper body mechanics, Decreased activity tolerance, Decreased endurance, Decreased strength, Decreased balance, Difficulty walking, Impaired flexibility  Visit Diagnosis: Acute right-sided low back pain without sciatica     Problem List Patient Active Problem List   Diagnosis Date Noted  . Paroxysmal atrial fibrillation (HCC) 05/05/2015  . Epigastric pain 05/14/2014  . Palpitations 03/04/2014  . New onset a-fib (HCC) 03/04/2014  . Shortness of breath 03/04/2014  . Asthma, mild intermittent 03/04/2014  . Epigastric abdominal pain 03/04/2014   [name] PT, DPT [name] 05/27/2017, 10:20 AM  [place] [facility] SPORTS MEDICINE [address] [place], [address] Phone: [phone]   Fax:  [phone]  Name: Kayla Castro MRN: [id number] Date of Birth: 18-Jun-1970

## 2017-05-29 ENCOUNTER — Encounter: Payer: BLUE CROSS/BLUE SHIELD | Admitting: Physical Therapy

## 2017-05-30 ENCOUNTER — Ambulatory Visit: Payer: BLUE CROSS/BLUE SHIELD

## 2017-06-03 ENCOUNTER — Ambulatory Visit: Payer: BLUE CROSS/BLUE SHIELD | Admitting: Physical Therapy

## 2017-06-03 ENCOUNTER — Encounter: Payer: Self-pay | Admitting: Physical Therapy

## 2017-06-03 DIAGNOSIS — M545 Low back pain, unspecified: Secondary | ICD-10-CM

## 2017-06-03 NOTE — Therapy (Signed)
[place] [facility] PH[facility] SPORTS MEDICINE [address] [place], [address] Phone: [phone]   Fax:  [phone]  Physical Therapy Treatment  Patient Details  Name: Kayla Castro MRN: [id number] Date of Birth: 1970/05/10 Referring Provider: Renard Matter MD   Encounter Date: 06/03/2017  PT End of Session - 06/03/17 1110    Visit Number  3    Number of Visits  13    Date for PT Re-Evaluation  07/01/17    PT Start Time  1030    PT Stop Time  1115    PT Time Calculation (min)  45 min    Activity Tolerance  Patient tolerated treatment well    Behavior During Therapy  [facility] for tasks assessed/performed       Past Medical History:  Diagnosis Date  . Anemia   . Asthma    Dr. Mayo Ao  . Asthma, mild intermittent 03/04/2014  . Atrial fibrillation (HCC)   . Colon polyp 2014  . Epigastric pain 05/14/2014  . GERD (gastroesophageal reflux disease)   . History of kidney stones   . New onset a-fib [facility]) 2016   Dr. Mariah Milling  . Palpitations   . Shortness of breath 03/04/2014    Past Surgical History:  Procedure Laterality Date  . ABDOMINAL HYSTERECTOMY  2007  . CESAREAN SECTION     x 2  . COLONOSCOPY  2014   Dr Bluford Kaufmann  . ESOPHAGOGASTRODUODENOSCOPY (EGD) WITH PROPOFOL N/A 10/08/2014   Procedure: ESOPHAGOGASTRODUODENOSCOPY (EGD) WITH PROPOFOL;  Surgeon: Wallace Cullens, MD;  Location: [facility] ENDOSCOPY;  Service: Gastroenterology;  Laterality: N/A;  . scar tissue excision x6  2004, 2006   x6  . UPPER GI ENDOSCOPY  2014, 2016   Dr. Bluford Kaufmann    There were no vitals filed for this visit.  Subjective Assessment - 06/03/17 1033    Subjective  Pt reports she really thinks the manual and ESTIM helped her pain over the weekend with her long drive to [facility], and reports her pain during this drive was only 1/32 at worst. Patient reports she is having minimal pain today as well. Patient reports that though her pain is decreased, it is more diffuse accross the spine, with cheif  pain complaint today in the neck, that she reports is giving her some numbness in bilat UE with prolonged movement. Reports compliance with HEP with no pain.     Pertinent History  Pt is 47 year old female with low back pain that she reports has gradually gotten worse over the past 2 weeks. Patient reports her pain does not radiate down the LE's and stays localized to the low back/tailbone area. Patient reports pain dull and feels like a deep bruise. 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     Limitations  Sitting;Lifting;Standing;Walking;House hold activities    How long can you sit comfortably?  10 mins    How long can you stand comfortably?  30 mins    How long can you walk comfortably?  30 mins    Diagnostic tests  X Rays: lumbar degenerative arthritis, degeneration of intervertebral disc at L5-S1, cervical spondylosis without myelopathy    Patient Stated Goals  Reduce pain to be able to complete work activity, and to accompany son to ride to [place]    Pain Onset  1 to 4 weeks ago         Manual -R lumbar paraspinal STM w/ trigger point release  -Bilat UT STM w/ trigger point release L w/ increased tightness and trigger points at L UT -C6-8 CPA mobs GIII-IV 30sec bouts 6 bouts each segment to improve motion and decrease numbness from nerve root compression -Bilat hamstring contract relax 10sec contract 20sec relax with increasing ROM stretch 3x each -Elys stretch 2x 45sec holds each LE    Ther-Ex -Lower lumbar rotation x 20 (10 each side) with cuing for oblique contraction  -Lower lumbar rotation 2 x 20 (10 each side) with therapist resistance with patient reporting some discomfort with R rotation at R paraspinals that subsides following PT cuing for oblique contraction and increased reps -Bridge exercise 3x 10 with cuing for  glute contraction and maintaining core stability throughout exercise -UT stretch 1x 30sec each side (added to HEP)      ESTIM + heat pack [employer] ESTIM 15 min at patient tolerated 60V increased to 70V through treatment at bilat UT area . Attempted d/t success of treatment at previous session success. With PT assessing patient tolerance throughout (increasing intensity as needed), monitoring skin integrity (normal), with decreased pain noted from patient                   PT Education - 06/03/17 1109    Education provided  Yes    Education Details  Education on nerve pain: cause, anatomy and physiology, and symptoms    Person(s) Educated  Patient    Methods  Explanation;Tactile cues;Verbal cues    Comprehension  Verbalized understanding;Verbal cues required       PT Short Term Goals - 05/20/17 1011      PT SHORT TERM GOAL #1   Title  Pt will be independent with HEP in order to improve strength and balance in order to decrease fall risk and improve function at home and work.    Time  2    Period  Weeks    Status  New        PT Long Term Goals - 05/20/17 1014      PT LONG TERM GOAL #1   Title   Pt will decrease worst pain as reported on NPRS by at least 3 points in order to demonstrate clinically significant reduction in pain.     Baseline  4/15 worst 4/10    Time  6    Period  Weeks    Status  New      PT LONG TERM GOAL #2   Title  Patient will increase FOTO score to 71 to demonstrate predicted increase in functional mobility to complete ADLs    Baseline  4/15 53    Time  6    Period  Weeks    Status  New      PT LONG TERM GOAL #3   Title   Pt will decrease mODI scoreby at least 13 points in order demonstrate clinically significant reduction in pain/disability    Baseline  4/15 42%    Time  6    Period  Weeks    Status  New            Plan - 06/03/17 1245    Clinical Impression Statement  Patient presents with increased tightness and trigger  points at bilat UT following long commute to [place] this weekend, with some hypomobility in C spine restricting cervical rotation 25% bilat. Following  manual and modality treatment patient has full cervical ROM and decreased pain. Patient LE respond well to stretching with noted decreased tightness. Patient is still needing some cuing for proper core contraction, but is able to do so following PT cuing.     Clinical Impairments Affecting Rehab Potential  (+) young age, active job, motivation (-) sedentary lifestyle, multiple pain sites,     PT Frequency  2x / week    PT Duration  6 weeks    PT Treatment/Interventions  Gait training;Neuromuscular re-education;Passive range of motion;Dry needling;Manual techniques;Energy conservation;Balance training;Therapeutic exercise;Therapeutic activities;Patient/family education;Functional mobility training;Stair training;Moist Heat;Traction;Ultrasound;Electrical Stimulation;Cryotherapy;Aquatic Therapy;ADLs/Self Care Home Management    PT Next Visit Plan  core strengthening; manual and modalities to soft tissue restrictions; proper body mechanics with work duties; gait training    PT Home Exercise Plan  normalizing gait, L lateral child's pose, lumbar flexion stretch, tennis ball massage    Consulted and Agree with Plan of Care  Patient       Patient will benefit from skilled therapeutic intervention in order to improve the following deficits and impairments:  Abnormal gait, Decreased mobility, Postural dysfunction, Increased fascial restricitons, Improper body mechanics, Decreased activity tolerance, Decreased endurance, Decreased strength, Decreased balance, Difficulty walking, Impaired flexibility  Visit Diagnosis: Acute right-sided low back pain without sciatica     Problem List Patient Active Problem List   Diagnosis Date Noted  . Paroxysmal atrial fibrillation (HCC) 05/05/2015  . Epigastric pain 05/14/2014  . Palpitations 03/04/2014  . New onset a-fib  (HCC) 03/04/2014  . Shortness of breath 03/04/2014  . Asthma, mild intermittent 03/04/2014  . Epigastric abdominal pain 03/04/2014   [name] PT, DPT [name] 06/03/2017, 1:01 PM  [place] [facility] PHYSICAL AND SPORTS MEDICINE [address], [place], [address] Phone: [phone]   Fax:  [phone]  Name: Kayla Castro MRN: [id number] Date of Birth: November 13, 1970

## 2017-06-03 NOTE — Therapy (Signed)
[place] [facility] PHYS[facility]TS MEDICINE [address], [facility] [address] Phone: [phone]   Fax:  [phone]  Physical Therapy Treatment  Patient Details  Name: Kayla Castro MRN: [id number] Date of Birth: 13-Apr-1970 Referring Provider: Renard Matter MD   Encounter Date: 05/27/2017    Past Medical History:  Diagnosis Date  . Anemia   . Asthma    Dr. Mayo Ao  . Asthma, mild intermittent 03/04/2014  . Atrial fibrillation (HCC)   . Colon polyp 2014  . Epigastric pain 05/14/2014  . GERD (gastroesophageal reflux disease)   . History of kidney stones   . New onset a-fib [facility]) 2016   Dr. Mariah Milling  . Palpitations   . Shortness of breath 03/04/2014    Past Surgical History:  Procedure Laterality Date  . ABDOMINAL HYSTERECTOMY  2007  . CESAREAN SECTION     x 2  . COLONOSCOPY  2014   Dr Bluford Kaufmann  . ESOPHAGOGASTRODUODENOSCOPY (EGD) WITH PROPOFOL N/A 10/08/2014   Procedure: ESOPHAGOGASTRODUODENOSCOPY (EGD) WITH PROPOFOL;  Surgeon: Wallace Cullens, MD;  Location: [facility] ENDOSCOPY;  Service: Gastroenterology;  Laterality: N/A;  . scar tissue excision x6  2004, 2006   x6  . UPPER GI ENDOSCOPY  2014, 2016   Dr. Bluford Kaufmann    There were no vitals filed for this visit.                              PT Short Term Goals - 05/20/17 1011      PT SHORT TERM GOAL #1   Title  Pt will be independent with HEP in order to improve strength and balance in order to decrease fall risk and improve function at home and work.    Time  2    Period  Weeks    Status  New        PT Long Term Goals - 05/20/17 1014      PT LONG TERM GOAL #1   Title   Pt will decrease worst pain as reported on NPRS by at least 3 points in order to demonstrate clinically significant reduction in pain.     Baseline  4/15 worst 4/10    Time  6    Period  Weeks    Status  New      PT LONG TERM GOAL #2   Title  Patient will increase FOTO score to 71 to demonstrate predicted  increase in functional mobility to complete ADLs    Baseline  4/15 53    Time  6    Period  Weeks    Status  New      PT LONG TERM GOAL #3   Title   Pt will decrease mODI scoreby at least 13 points in order demonstrate clinically significant reduction in pain/disability    Baseline  4/15 42%    Time  6    Period  Weeks    Status  New              Patient will benefit from skilled therapeutic intervention in order to improve the following deficits and impairments:  Abnormal gait, Decreased mobility, Postural dysfunction, Increased fascial restricitons, Improper body mechanics, Decreased activity tolerance, Decreased endurance, Decreased strength, Decreased balance, Difficulty walking, Impaired flexibility  Visit Diagnosis: Acute right-sided low back pain without sciatica     Problem List Patient Active Problem List   Diagnosis Date Noted  . Paroxysmal  atrial fibrillation (HCC) 05/05/2015  . Epigastric pain 05/14/2014  . Palpitations 03/04/2014  . New onset a-fib (HCC) 03/04/2014  . Shortness of breath 03/04/2014  . Asthma, mild intermittent 03/04/2014  . Epigastric abdominal pain 03/04/2014    [name] 06/03/2017, 9:40 AM  [place] [facility] PHYSICAL AND SPORTS MEDICINE [address] [place], [address] Phone: [phone]   Fax:  [phone]  Name: AMEKA KRIGBAUM MRN: [id number] Date of Birth: Apr 29, 1970

## 2017-06-05 ENCOUNTER — Encounter: Payer: BLUE CROSS/BLUE SHIELD | Admitting: Physical Therapy

## 2017-06-06 ENCOUNTER — Ambulatory Visit: Payer: BLUE CROSS/BLUE SHIELD

## 2017-06-11 ENCOUNTER — Ambulatory Visit: Payer: BLUE CROSS/BLUE SHIELD | Admitting: Physical Therapy

## 2017-06-11 ENCOUNTER — Ambulatory Visit: Payer: BLUE CROSS/BLUE SHIELD

## 2017-06-13 ENCOUNTER — Ambulatory Visit: Payer: BLUE CROSS/BLUE SHIELD | Attending: Internal Medicine

## 2017-06-13 ENCOUNTER — Other Ambulatory Visit: Payer: Self-pay

## 2017-06-13 DIAGNOSIS — M545 Low back pain, unspecified: Secondary | ICD-10-CM

## 2017-06-13 NOTE — Therapy (Addendum)
[place] [facility] MAIN [facility] SERVICES [address] [place], [place], [address] Phone: [phone]   Fax:  [phone]  Physical Therapy Treatment  Patient Details  Name: Kayla Castro MRN: [id number] Date of Birth: December 16, 1970 Referring Provider: Renard Matter MD   Encounter Date: 06/13/2017  PT End of Session - 06/13/17 1529    Visit Number  4    Number of Visits  13    Date for PT Re-Evaluation  07/01/17    PT Start Time  0935    PT Stop Time  1030    PT Time Calculation (min)  55 min    Activity Tolerance  Patient tolerated treatment well    Behavior During Therapy  [facility] for tasks assessed/performed       Past Medical History:  Diagnosis Date  . Anemia   . Asthma    Dr. Mayo Ao  . Asthma, mild intermittent 03/04/2014  . Atrial fibrillation (HCC)   . Colon polyp 2014  . Epigastric pain 05/14/2014  . GERD (gastroesophageal reflux disease)   . History of kidney stones   . New onset a-fib [facility]) 2016   Dr. Mariah Milling  . Palpitations   . Shortness of breath 03/04/2014    Past Surgical History:  Procedure Laterality Date  . ABDOMINAL HYSTERECTOMY  2007  . CESAREAN SECTION     x 2  . COLONOSCOPY  2014   Dr Bluford Kaufmann  . ESOPHAGOGASTRODUODENOSCOPY (EGD) WITH PROPOFOL N/A 10/08/2014   Procedure: ESOPHAGOGASTRODUODENOSCOPY (EGD) WITH PROPOFOL;  Surgeon: Wallace Cullens, MD;  Location: [facility] ENDOSCOPY;  Service: Gastroenterology;  Laterality: N/A;  . scar tissue excision x6  2004, 2006   x6  . UPPER GI ENDOSCOPY  2014, 2016   Dr. Bluford Kaufmann    There were no vitals filed for this visit.  Subjective Assessment - 06/13/17 1526    Subjective  Pt denies any back/neck pain currently. States pain usually occurs with prolonged sitting.     Pertinent History  Pt is 47 year old female with low back pain that she reports has gradually gotten worse over the past 2 weeks. Patient reports her pain does not radiate down the LE's and stays localized to the low back/tailbone area. Patient  reports pain dull and feels like a deep bruise. 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       Ambulation  4 L fwd  4 L side   Core with LE strengthening  Hip abd/add  Hip flex/ext  Squats  Core, bench with LE strength, 2 min ea  Bike  Scissor  Flutter  Core with UE strengthening  Red dumbbells, 2 x 10 ea   Triceps press downs   Sh abd/add   Sh flex/ext   Sh horiz abd/add  Bench, core   1#, 2 x 10 ea   Small SKTC tucks, B   Straight leg with ER, small outward hip circles, B   SL up and outs, B  Modified planks, 3 x 30 sec ea  Fwd  Side, B  Stretching, at step, B 3 x 15 sec ea (given handout)  Hams/gastrocs  Hip flexor/quads                           PT Education - 06/13/17 1528  Education provided  Yes    Education Details  Properties of water and concepts as it relates to water exercise/activity. Core stabilization, Stretching, Modified planks    Person(s) Educated  Patient    Methods  Explanation;Demonstration    Comprehension  Verbalized understanding;Returned demonstration;Verbal cues required       PT Short Term Goals - 05/20/17 1011      PT SHORT TERM GOAL #1   Title  Pt will be independent with HEP in order to improve strength and balance in order to decrease fall risk and improve function at home and work.    Time  2    Period  Weeks    Status  New        PT Long Term Goals - 05/20/17 1014      PT LONG TERM GOAL #1   Title   Pt will decrease worst pain as reported on NPRS by at least 3 points in order to demonstrate clinically significant reduction in pain.     Baseline  4/15 worst 4/10    Time  6    Period  Weeks    Status  New      PT LONG TERM GOAL #2   Title  Patient will increase FOTO score to 71 to demonstrate predicted increase in functional mobility to  complete ADLs    Baseline  4/15 53    Time  6    Period  Weeks    Status  New      PT LONG TERM GOAL #3   Title   Pt will decrease mODI scoreby at least 13 points in order demonstrate clinically significant reduction in pain/disability    Baseline  4/15 42%    Time  6    Period  Weeks    Status  New            Plan - 06/13/17 1529    Clinical Impression Statement  Pt tolerated exercises well with good understanding and demonstration of proper techniques with cueing. Feel pt will progress nicely with core strengthening.     Clinical Impairments Affecting Rehab Potential  (+) young age, active job, motivation (-) sedentary lifestyle, multiple pain sites,     PT Frequency  2x / week    PT Duration  6 weeks    PT Treatment/Interventions  Gait training;Neuromuscular re-education;Passive range of motion;Dry needling;Manual techniques;Energy conservation;Balance training;Therapeutic exercise;Therapeutic activities;Patient/family education;Functional mobility training;Stair training;Moist Heat;Traction;Ultrasound;Electrical Stimulation;Cryotherapy;Aquatic Therapy;ADLs/Self Care Home Management    PT Next Visit Plan  core strengthening; manual and modalities to soft tissue restrictions; proper body mechanics with work duties; gait training    PT Home Exercise Plan  normalizing gait, L lateral child's pose, lumbar flexion stretch, tennis ball massage    Consulted and Agree with Plan of Care  Patient       Patient will benefit from skilled therapeutic intervention in order to improve the following deficits and impairments:  Abnormal gait, Decreased mobility, Postural dysfunction, Increased fascial restricitons, Improper body mechanics, Decreased activity tolerance, Decreased endurance, Decreased strength, Decreased balance, Difficulty walking, Impaired flexibility  Visit Diagnosis: Acute right-sided low back pain without sciatica     Problem List Patient Active Problem List   Diagnosis  Date Noted  . Paroxysmal atrial fibrillation (HCC) 05/05/2015  . Epigastric pain 05/14/2014  . Palpitations 03/04/2014  . New onset a-fib (HCC) 03/04/2014  . Shortness of breath 03/04/2014  . Asthma, mild intermittent 03/04/2014  . Epigastric abdominal pain 03/04/2014  Scot Dock 06/13/2017, 3:32 PM  [place] [facility] SERVICES [address] [place], [place], [address] Phone: [phone]   Fax:  [phone]  Name: Kayla Castro MRN: [id number] Date of Birth: [birth year]

## 2017-06-13 NOTE — Patient Instructions (Signed)
Written instruction on hamstrings and hip flexor stretching at step

## 2017-06-17 ENCOUNTER — Ambulatory Visit: Payer: BLUE CROSS/BLUE SHIELD | Admitting: Physical Therapy

## 2017-06-20 ENCOUNTER — Ambulatory Visit: Payer: BLUE CROSS/BLUE SHIELD

## 2017-06-24 ENCOUNTER — Ambulatory Visit: Payer: BLUE CROSS/BLUE SHIELD | Admitting: Physical Therapy

## 2017-06-24 ENCOUNTER — Encounter: Payer: Self-pay | Admitting: Physical Therapy

## 2017-06-24 DIAGNOSIS — M545 Low back pain, unspecified: Secondary | ICD-10-CM

## 2017-06-24 NOTE — Therapy (Addendum)
[facility] PHYS[facility] AND SPORTS MEDICINE [address], [place], [address] Phone: [phone]   Fax:  [phone]  Physical Therapy Treatment  Patient Details  Name: Kayla Castro MRN: [id number] Date of Birth: 1970/11/03 Referring Provider: Renard Matter MD   Encounter Date: 06/24/2017  PT End of Session - 06/24/17 1007    Visit Number  5    Number of Visits  13    Date for PT Re-Evaluation  07/01/17    PT Start Time  0946    PT Stop Time  1026    PT Time Calculation (min)  40 min    Activity Tolerance  Patient tolerated treatment well    Behavior During Therapy  [facility] for tasks assessed/performed       Past Medical History:  Diagnosis Date  . Anemia   . Asthma    Dr. Mayo Ao  . Asthma, mild intermittent 03/04/2014  . Atrial fibrillation (HCC)   . Colon polyp 2014  . Epigastric pain 05/14/2014  . GERD (gastroesophageal reflux disease)   . History of kidney stones   . New onset a-fib [facility]) 2016   Dr. Mariah Milling  . Palpitations   . Shortness of breath 03/04/2014    Past Surgical History:  Procedure Laterality Date  . ABDOMINAL HYSTERECTOMY  2007  . CESAREAN SECTION     x 2  . COLONOSCOPY  2014   Dr Bluford Kaufmann  . ESOPHAGOGASTRODUODENOSCOPY (EGD) WITH PROPOFOL N/A 10/08/2014   Procedure: ESOPHAGOGASTRODUODENOSCOPY (EGD) WITH PROPOFOL;  Surgeon: Wallace Cullens, MD;  Location: [facility] ENDOSCOPY;  Service: Gastroenterology;  Laterality: N/A;  . scar tissue excision x6  2004, 2006   x6  . UPPER GI ENDOSCOPY  2014, 2016   Dr. Bluford Kaufmann    There were no vitals filed for this visit.  Subjective Assessment - 06/24/17 0951    Subjective  Pt denies any back pain currently. Reports pain in the neck that she attributes to sleeping on the love seat as she has a house guest this week. Patient rates her neck pain 4/10. Patient reports compliance with her HEP.    Pertinent History  Pt is 47 year old female with low back pain that she reports has gradually gotten worse over  the past 2 weeks. Patient reports her pain does not radiate down the LE's and stays localized to the low back/tailbone area. Patient reports pain dull and feels like a deep bruise. 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     Limitations  Sitting;Lifting;Standing;Walking;House hold activities    How long can you sit comfortably?  10 mins    How long can you stand comfortably?  30 mins    How long can you walk comfortably?  30 mins    Diagnostic tests  X Rays: lumbar degenerative arthritis, degeneration of intervertebral disc at L5-S1, cervical spondylosis without myelopathy    Patient Stated Goals  Reduce pain to be able to complete work activity, and to accompany son to ride to [place]    Pain Onset  1 to 4 weeks ago        Manual -PT assessed lumbar paraspinals with no noted trigger points or pain response from patient, so discontinued -Bilat UT STM w/ trigger point release; discontinued on R as there are no noted trigger points here -  Prolonged time spent at [facility] w/ trigger point release to L levator (patient's chief pain complaint with increased trigger points here)    Ther-Ex -TA marching 3x 10e with PT demo initially and min cuing to maintain core contraction with low back contact on mat throughout exercise  -Deadbug exercise 3x 5e with PT demo initially and min cuing for proper form -Bridge 3x 12 -Levator stretch 1x 30sec each side (added to HEP)   ESTIM+ heat packHiVolt ESTIM12 min at patient tolerated150Vincreased to155V through treatmentat L levatorarea. Attempted d/t success of treatment at previous session success. With PT assessing patient tolerance throughout (increasing intensity as needed), monitoring skin integrity (normal), with decreased pain noted from patient                           PT Education - 06/24/17 1007    Education provided  Yes    Education Details  Exercise form    Person(s) Educated  Patient    Methods  Explanation;Verbal cues;Demonstration    Comprehension  Verbalized understanding;Returned demonstration;Verbal cues required       PT Short Term Goals - 05/20/17 1011      PT SHORT TERM GOAL #1   Title  Pt will be independent with HEP in order to improve strength and balance in order to decrease fall risk and improve function at home and work.    Time  2    Period  Weeks    Status  New        PT Long Term Goals - 05/20/17 1014      PT LONG TERM GOAL #1   Title   Pt will decrease worst pain as reported on NPRS by at least 3 points in order to demonstrate clinically significant reduction in pain.     Baseline  4/15 worst 4/10    Time  6    Period  Weeks    Status  New      PT LONG TERM GOAL #2   Title  Patient will increase FOTO score to 71 to demonstrate predicted increase in functional mobility to complete ADLs    Baseline  4/15 53    Time  6    Period  Weeks    Status  New      PT LONG TERM GOAL #3   Title   Pt will decrease mODI scoreby at least 13 points in order demonstrate clinically significant reduction in pain/disability    Baseline  4/15 42%    Time  6    Period  Weeks    Status  New            Plan - 06/24/17 1010    Clinical Impression Statement  Pt is experiencing little to no LBP this session, with noted pain in L>R cervical musculature. PT utilized [employer] + modality techniques for pain and muscle spasm modulation in this area, which patient reported comoplete pain relief from. PT continued to lead pt through core progression which patient was able to tolerated with no increased pain, only muscle fatigue. Pt was able to complete core exercises with proper form following PT cuing and demonstration 100%. PT will continue pain modulation techniques as well as core/hip  strenghtening as able.     Rehab Potential  Good    Clinical Impairments Affecting Rehab Potential  (+) young age, active job, motivation (-) sedentary lifestyle, multiple pain sites,     PT Frequency  2x /  week    PT Duration  6 weeks    PT Treatment/Interventions  Gait training;Neuromuscular re-education;Passive range of motion;Dry needling;Manual techniques;Energy conservation;Balance training;Therapeutic exercise;Therapeutic activities;Patient/family education;Functional mobility training;Stair training;Moist Heat;Traction;Ultrasound;Electrical Stimulation;Cryotherapy;Aquatic Therapy;ADLs/Self Care Home Management    PT Next Visit Plan  core strengthening; manual and modalities to soft tissue restrictions; proper body mechanics with work duties; gait training    PT Home Exercise Plan  5/20: UT stretch and levator stretch eval: normalizing gait, L lateral child's pose, lumbar flexion stretch, tennis ball massage    Consulted and Agree with Plan of Care  Patient       Patient will benefit from skilled therapeutic intervention in order to improve the following deficits and impairments:  Abnormal gait, Decreased mobility, Postural dysfunction, Increased fascial restricitons, Improper body mechanics, Decreased activity tolerance, Decreased endurance, Decreased strength, Decreased balance, Difficulty walking, Impaired flexibility  Visit Diagnosis: Acute right-sided low back pain without sciatica     Problem List Patient Active Problem List   Diagnosis Date Noted  . Paroxysmal atrial fibrillation (HCC) 05/05/2015  . Epigastric pain 05/14/2014  . Palpitations 03/04/2014  . New onset a-fib (HCC) 03/04/2014  . Shortness of breath 03/04/2014  . Asthma, mild intermittent 03/04/2014  . Epigastric abdominal pain 03/04/2014   [name] PT, DPT [name] 06/24/2017, 10:32 AM  [place] [facility] PHYSICAL AND SPORTS MEDICINE [address],  [place], [address] Phone: [phone]   Fax:  [phone]  Name: AVIV ROTA MRN: [id number] Date of Birth: 1970/09/30

## 2017-06-25 ENCOUNTER — Ambulatory Visit: Payer: BLUE CROSS/BLUE SHIELD

## 2017-07-02 ENCOUNTER — Ambulatory Visit: Payer: BLUE CROSS/BLUE SHIELD | Admitting: Physical Therapy

## 2017-07-04 ENCOUNTER — Other Ambulatory Visit: Payer: Self-pay

## 2017-07-04 ENCOUNTER — Ambulatory Visit: Payer: BLUE CROSS/BLUE SHIELD

## 2017-07-04 DIAGNOSIS — M545 Low back pain, unspecified: Secondary | ICD-10-CM

## 2017-07-04 NOTE — Therapy (Signed)
[place] [facility] MAIN [facility] SERVICES [address] [place], [place], [address] Phone: [phone]   Fax:  [phone]  Physical Therapy Treatment  Patient Details  Name: Kayla Castro MRN: [id number] Date of Birth: 1970-08-31 Referring Provider: Renard Matter MD   Encounter Date: 07/04/2017  PT End of Session - 07/04/17 1551    Visit Number  6    Number of Visits  13    Date for PT Re-Evaluation  07/01/17    PT Start Time  0845    PT Stop Time  0945    PT Time Calculation (min)  60 min    Activity Tolerance  Patient tolerated treatment well    Behavior During Therapy  [facility] for tasks assessed/performed       Past Medical History:  Diagnosis Date  . Anemia   . Asthma    Dr. Mayo Ao  . Asthma, mild intermittent 03/04/2014  . Atrial fibrillation (HCC)   . Colon polyp 2014  . Epigastric pain 05/14/2014  . GERD (gastroesophageal reflux disease)   . History of kidney stones   . New onset a-fib [facility]) 2016   Dr. Mariah Milling  . Palpitations   . Shortness of breath 03/04/2014    Past Surgical History:  Procedure Laterality Date  . ABDOMINAL HYSTERECTOMY  2007  . CESAREAN SECTION     x 2  . COLONOSCOPY  2014   Dr Bluford Kaufmann  . ESOPHAGOGASTRODUODENOSCOPY (EGD) WITH PROPOFOL N/A 10/08/2014   Procedure: ESOPHAGOGASTRODUODENOSCOPY (EGD) WITH PROPOFOL;  Surgeon: Wallace Cullens, MD;  Location: [facility] ENDOSCOPY;  Service: Gastroenterology;  Laterality: N/A;  . scar tissue excision x6  2004, 2006   x6  . UPPER GI ENDOSCOPY  2014, 2016   Dr. Bluford Kaufmann    There were no vitals filed for this visit.  Subjective Assessment - 07/04/17 1546    Subjective  Pt denies pain currently. Pt also reports tolerating last aquatic session well.     Pertinent History  Pt is 47 year old female with low back pain that she reports has gradually gotten worse over the past 2 weeks. Patient reports her pain does not radiate down the LE's and stays localized to the low back/tailbone area. Patient reports pain  dull and feels like a deep bruise. 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       Enters/exits via ramp   Ambulation   4L fwd  2L sidestep  2L sidestep with squat  2L fwd lunge  LE/core strength with 2# ankle wts, B,   Hip abd/add, 30x  Hip flex/ext, 30x   Squat with green dumbbells, 20x  Stand lateral oblique crunches, blue dumbbell, B 20x ea  Step up with contralateral hip ext 2# wt, B, 20x ea  High plank, 2# ankle wts, B 2 x 10 ea  SKTC  Cross tuck  Hip ext  SL jack   High plank hold with SL hip extension 2#, B, 2 x 30" each  Side plank starfish 2#, B, 2 x 1' each                           PT Education - 07/04/17 1547    Education provided  Yes    Education Details  sidestepping with squat; lunge fwd walking, use  of wts to increase level of difficulty/strength gains; use of bouyant dumbbells for increasing level of difficulty working downward in water; high plank abdominal and LE work     [employer]) Educated  Patient    Methods  Explanation;Demonstration    Comprehension  Verbalized understanding;Returned demonstration;Verbal cues required       PT Short Term Goals - 05/20/17 1011      PT SHORT TERM GOAL #1   Title  Pt will be independent with HEP in order to improve strength and balance in order to decrease fall risk and improve function at home and work.    Time  2    Period  Weeks    Status  New        PT Long Term Goals - 05/20/17 1014      PT LONG TERM GOAL #1   Title   Pt will decrease worst pain as reported on NPRS by at least 3 points in order to demonstrate clinically significant reduction in pain.     Baseline  4/15 worst 4/10    Time  6    Period  Weeks    Status  New      PT LONG TERM GOAL #2   Title  Patient will increase FOTO score to 71 to demonstrate predicted  increase in functional mobility to complete ADLs    Baseline  4/15 53    Time  6    Period  Weeks    Status  New      PT LONG TERM GOAL #3   Title   Pt will decrease mODI scoreby at least 13 points in order demonstrate clinically significant reduction in pain/disability    Baseline  4/15 42%    Time  6    Period  Weeks    Status  New            Plan - 07/04/17 1552    Clinical Impression Statement  Pt tolerated treatment well. Demonstrates good form with verbal cues requires intermittently particularly in high plank to maintain proper plank form. LLE with greater weakness at hip than R and mildly evident with core/LE work. Will continue to progress core/LE strengthening.     Rehab Potential  Good    Clinical Impairments Affecting Rehab Potential  (+) young age, active job, motivation (-) sedentary lifestyle, multiple pain sites,     PT Frequency  2x / week    PT Duration  6 weeks    PT Treatment/Interventions  Gait training;Neuromuscular re-education;Passive range of motion;Dry needling;Manual techniques;Energy conservation;Balance training;Therapeutic exercise;Therapeutic activities;Patient/family education;Functional mobility training;Stair training;Moist Heat;Traction;Ultrasound;Electrical Stimulation;Cryotherapy;Aquatic Therapy;ADLs/Self Care Home Management    PT Next Visit Plan  core strengthening; manual and modalities to soft tissue restrictions; proper body mechanics with work duties; gait training    PT Home Exercise Plan  5/20: UT stretch and levator stretch eval: normalizing gait, L lateral child's pose, lumbar flexion stretch, tennis ball massage    Consulted and Agree with Plan of Care  Patient       Patient will benefit from skilled therapeutic intervention in order to improve the following deficits and impairments:  Abnormal gait, Decreased mobility, Postural dysfunction, Increased fascial restricitons, Improper body mechanics, Decreased activity tolerance, Decreased  endurance, Decreased strength, Decreased balance, Difficulty walking, Impaired flexibility  Visit Diagnosis: Acute right-sided low back pain without sciatica     Problem List Patient Active Problem List   Diagnosis Date Noted  . Paroxysmal atrial fibrillation (HCC) 05/05/2015  .  Epigastric pain 05/14/2014  . Palpitations 03/04/2014  . New onset a-fib (HCC) 03/04/2014  . Shortness of breath 03/04/2014  . Asthma, mild intermittent 03/04/2014  . Epigastric abdominal pain 03/04/2014    Scot Dock 07/04/2017, 3:55 PM  [place] [facility] MAIN [facility] SERVICES [address] [place], [place], [address] Phone: [phone]   Fax:  [phone]  Name: Kayla Castro MRN: [id number] Date of Birth: May 20, 1970

## 2017-07-08 ENCOUNTER — Ambulatory Visit: Payer: BLUE CROSS/BLUE SHIELD | Attending: Internal Medicine | Admitting: Physical Therapy

## 2017-07-11 ENCOUNTER — Ambulatory Visit: Payer: BLUE CROSS/BLUE SHIELD

## 2017-07-16 ENCOUNTER — Other Ambulatory Visit: Payer: Self-pay | Admitting: Obstetrics and Gynecology

## 2017-07-16 DIAGNOSIS — N941 Unspecified dyspareunia: Secondary | ICD-10-CM | POA: Diagnosis not present

## 2017-07-16 DIAGNOSIS — Z1231 Encounter for screening mammogram for malignant neoplasm of breast: Secondary | ICD-10-CM | POA: Diagnosis not present

## 2017-07-16 DIAGNOSIS — Z1211 Encounter for screening for malignant neoplasm of colon: Secondary | ICD-10-CM | POA: Diagnosis not present

## 2017-07-16 DIAGNOSIS — Z01419 Encounter for gynecological examination (general) (routine) without abnormal findings: Secondary | ICD-10-CM | POA: Diagnosis not present

## 2017-07-17 DIAGNOSIS — M47812 Spondylosis without myelopathy or radiculopathy, cervical region: Secondary | ICD-10-CM | POA: Diagnosis not present

## 2017-07-17 DIAGNOSIS — M5136 Other intervertebral disc degeneration, lumbar region: Secondary | ICD-10-CM | POA: Diagnosis not present

## 2017-07-17 DIAGNOSIS — Z01419 Encounter for gynecological examination (general) (routine) without abnormal findings: Secondary | ICD-10-CM | POA: Diagnosis not present

## 2017-08-15 ENCOUNTER — Ambulatory Visit
Admission: RE | Admit: 2017-08-15 | Discharge: 2017-08-15 | Disposition: A | Discharge status: Home / Self Care | Payer: BLUE CROSS/BLUE SHIELD | Source: Ambulatory Visit | Attending: Obstetrics and Gynecology | Admitting: Obstetrics and Gynecology

## 2017-08-15 DIAGNOSIS — Z1231 Encounter for screening mammogram for malignant neoplasm of breast: Secondary | ICD-10-CM | POA: Diagnosis not present

## 2017-08-19 ENCOUNTER — Other Ambulatory Visit: Payer: Self-pay | Admitting: Obstetrics and Gynecology

## 2017-08-19 DIAGNOSIS — R928 Other abnormal and inconclusive findings on diagnostic imaging of breast: Secondary | ICD-10-CM

## 2017-08-19 DIAGNOSIS — N6320 Unspecified lump in the left breast, unspecified quadrant: Secondary | ICD-10-CM

## 2017-08-20 ENCOUNTER — Ambulatory Visit: Payer: BLUE CROSS/BLUE SHIELD | Attending: Obstetrics and Gynecology

## 2017-08-20 ENCOUNTER — Other Ambulatory Visit: Payer: BLUE CROSS/BLUE SHIELD

## 2017-08-23 ENCOUNTER — Ambulatory Visit
Admission: RE | Admit: 2017-08-23 | Discharge: 2017-08-23 | Disposition: A | Discharge status: Home / Self Care | Payer: BLUE CROSS/BLUE SHIELD | Source: Ambulatory Visit | Attending: Obstetrics and Gynecology | Admitting: Obstetrics and Gynecology

## 2017-08-23 DIAGNOSIS — N6012 Diffuse cystic mastopathy of left breast: Secondary | ICD-10-CM | POA: Diagnosis not present

## 2017-08-23 DIAGNOSIS — R928 Other abnormal and inconclusive findings on diagnostic imaging of breast: Secondary | ICD-10-CM | POA: Diagnosis not present

## 2017-08-23 DIAGNOSIS — N6320 Unspecified lump in the left breast, unspecified quadrant: Secondary | ICD-10-CM

## 2017-08-23 DIAGNOSIS — R922 Inconclusive mammogram: Secondary | ICD-10-CM | POA: Diagnosis not present

## 2017-12-03 DIAGNOSIS — Z23 Encounter for immunization: Secondary | ICD-10-CM | POA: Diagnosis not present

## 2018-04-22 DIAGNOSIS — M722 Plantar fascial fibromatosis: Secondary | ICD-10-CM | POA: Diagnosis not present

## 2018-06-03 DIAGNOSIS — N390 Urinary tract infection, site not specified: Secondary | ICD-10-CM | POA: Diagnosis not present

## 2018-07-22 ENCOUNTER — Other Ambulatory Visit: Payer: Self-pay | Admitting: Obstetrics and Gynecology

## 2018-07-22 DIAGNOSIS — R87610 Atypical squamous cells of undetermined significance on cytologic smear of cervix (ASC-US): Secondary | ICD-10-CM | POA: Diagnosis not present

## 2018-07-22 DIAGNOSIS — R232 Flushing: Secondary | ICD-10-CM | POA: Diagnosis not present

## 2018-07-22 DIAGNOSIS — Z1272 Encounter for screening for malignant neoplasm of vagina: Secondary | ICD-10-CM | POA: Diagnosis not present

## 2018-07-22 DIAGNOSIS — Z01419 Encounter for gynecological examination (general) (routine) without abnormal findings: Secondary | ICD-10-CM | POA: Diagnosis not present

## 2018-07-22 DIAGNOSIS — R5383 Other fatigue: Secondary | ICD-10-CM | POA: Diagnosis not present

## 2018-07-22 DIAGNOSIS — Z8639 Personal history of other endocrine, nutritional and metabolic disease: Secondary | ICD-10-CM | POA: Diagnosis not present

## 2018-07-22 DIAGNOSIS — Z13220 Encounter for screening for lipoid disorders: Secondary | ICD-10-CM | POA: Diagnosis not present

## 2018-07-22 DIAGNOSIS — N6002 Solitary cyst of left breast: Secondary | ICD-10-CM | POA: Diagnosis not present

## 2018-08-26 ENCOUNTER — Other Ambulatory Visit: Payer: Self-pay

## 2018-08-26 ENCOUNTER — Ambulatory Visit
Admission: RE | Admit: 2018-08-26 | Discharge: 2018-08-26 | Disposition: A | Discharge status: Home / Self Care | Payer: BC Managed Care – PPO | Source: Ambulatory Visit | Attending: Obstetrics and Gynecology | Admitting: Obstetrics and Gynecology

## 2018-08-26 DIAGNOSIS — N6002 Solitary cyst of left breast: Secondary | ICD-10-CM | POA: Insufficient documentation

## 2018-08-26 DIAGNOSIS — R922 Inconclusive mammogram: Secondary | ICD-10-CM | POA: Diagnosis not present

## 2018-11-24 DIAGNOSIS — Z23 Encounter for immunization: Secondary | ICD-10-CM | POA: Diagnosis not present

## 2019-07-21 ENCOUNTER — Other Ambulatory Visit: Payer: Self-pay

## 2019-07-21 MED ORDER — FLECAINIDE ACETATE 50 MG PO TABS: 50.0000 mg | ORAL_TABLET | Freq: Two times a day (BID) | ORAL | 0 refills | Status: DC | PRN

## 2019-07-29 ENCOUNTER — Ambulatory Visit: Payer: BC Managed Care – PPO | Admitting: Physician Assistant

## 2019-07-29 ENCOUNTER — Other Ambulatory Visit: Payer: Self-pay | Admitting: Obstetrics and Gynecology

## 2019-07-29 DIAGNOSIS — N6002 Solitary cyst of left breast: Secondary | ICD-10-CM

## 2019-08-03 ENCOUNTER — Ambulatory Visit: Payer: BC Managed Care – PPO | Admitting: Family

## 2019-08-03 ENCOUNTER — Encounter: Payer: Self-pay | Admitting: Family

## 2019-08-03 ENCOUNTER — Other Ambulatory Visit: Payer: Self-pay

## 2019-08-03 VITALS — BP 122/74 | HR 74 | Ht 67.0 in | Wt 164.1 lb

## 2019-08-03 DIAGNOSIS — I480 Paroxysmal atrial fibrillation: Secondary | ICD-10-CM

## 2019-08-03 NOTE — Progress Notes (Signed)
Office Visit    Patient Name: Kayla Castro Date of Encounter: 08/03/2019  Primary Care Provider:  Sherrin Daisy, MD Primary Cardiologist:  Ida Rogue, MD Electrophysiologist:  None   Chief Complaint    Kayla Castro is a 49 y.o. female with a hx of asthma, anemia, paroxysmal atrial fibrillation presents today for follow-up of PAF  Past Medical History    Past Medical History:  Diagnosis Date  . Anemia   . Asthma    Dr. Vella Kohler  . Asthma, mild intermittent 03/04/2014  . Atrial fibrillation ([place])   . Colon polyp 2014  . Epigastric pain 05/14/2014  . GERD (gastroesophageal reflux disease)   . History of kidney stones   . New onset a-fib [facility]) 2016   Dr. Rockey Situ  . Palpitations   . Shortness of breath 03/04/2014   Past Surgical History:  Procedure Laterality Date  . ABDOMINAL HYSTERECTOMY  2007  . CESAREAN SECTION     x 2  . COLONOSCOPY  2014   Dr Candace Cruise  . ESOPHAGOGASTRODUODENOSCOPY (EGD) WITH PROPOFOL N/A 10/08/2014   Procedure: ESOPHAGOGASTRODUODENOSCOPY (EGD) WITH PROPOFOL;  Surgeon: Hulen Luster, MD;  Location: [facility] ENDOSCOPY;  Service: Gastroenterology;  Laterality: N/A;  . scar tissue excision x6  2004, 2006   x6  . UPPER GI ENDOSCOPY  2014, 2016   Dr. Candace Cruise    Allergies  Allergies  Allergen Reactions  . Sulfa Antibiotics Other (See Comments)    Flu like symptoms      History of Present Illness    Kayla Castro is a 49 y.o. female with a hx of asthma, anemia, paroxysmal atrial fibrillation last seen 12/2016 by Dr. Rockey Situ.  Remote history of stress test November 2019 no ischemia.  Her previous episodes of atrial fibrillation have typically started after GI distress.  Presented to [facility] January 2016 with abdominal pain, diarrhea, EKG showing atrial fibrillation 160 bpm.  Labs at that time with normal potassium, negative cardiac enzymes, normal TSH.  Echo with normal LVEF, normal right ventricular systolic pressure. She has previously been on Flecainide  for management.   Seen in ED 07/21/19 in [place].  She was brought in via EMS for palpitations.  She was in atrial fib per EMS report with rates 160s to 170s.  She was given diltiazem in route to the hospital and converted.  Chest x-ray with no acute findings.  Labs included magnesium 1.8, hemoglobin 13.5, WBC 11, troponin negative.   Labs via care everywhere 07/29/2019 at her OB/GYN showed TSH 2.438, total cholesterol 211, triglycerides 70, HDL 61.2, LDL 130, K4.2, creatinine 0.7, GFR 89, WBC 8.6, hemoglobin 12.9.  She and her family went to [place] to celebrate her son's graduation and spend a few days [place]. She had GI issues and then subsequent atrial fibrillation for which she called EMS.   Reports no recurrent episodes of atrial fibrillation. Did have coffee one morning and tells me she felt some palpitations.  She plans to avoid caffeine.  For her heart rate normally she will see between 70bpm and 100 bpm on her smart watch.  Reports no shortness of breath nor dyspnea on exertion. Reports no chest pain, pressure, or tightness. No edema, orthopnea, PND. Reports no palpitations.   EKGs/Labs/Other Studies Reviewed:   The following studies were reviewed today:  EKG:  EKG is ordered today.  The ekg ordered today demonstrates NSR 74 bpm.   Recent Labs: No results found for requested labs within last 8760 hours.  Recent Lipid Panel    Component Value Date/Time   CHOL 185 02/25/2014 0539   TRIG 60 02/25/2014 0539   HDL 62 (H) 02/25/2014 0539   VLDL 12 02/25/2014 0539   LDLCALC 111 (H) 02/25/2014 0539    Home Medications   Current Meds  Medication Sig  . albuterol (PROVENTIL HFA;VENTOLIN HFA) 108 (90 BASE) MCG/ACT inhaler Inhale 2 puffs into the lungs every 6 (six) hours as needed.   . flecainide (TAMBOCOR) 50 MG tablet Take 1 tablet (50 mg total) by mouth 2 (two) times daily as needed.  . meloxicam (MOBIC) 15 MG tablet Take 15 mg by mouth daily.  . Multiple Vitamin  (MULTIVITAMIN) capsule Take 1 capsule by mouth daily. Reported on 05/05/2015  . omeprazole (PRILOSEC) 40 MG capsule Take 40 mg daily by mouth.   . Potassium 95 MG TABS Take daily by mouth.      Review of Systems   Review of Systems  Constitutional: Negative for chills, fever and malaise/fatigue.  Cardiovascular: Positive for palpitations. Negative for chest pain, dyspnea on exertion, leg swelling, near-syncope, orthopnea and syncope.  Respiratory: Negative for cough, shortness of breath and wheezing.   Gastrointestinal: Negative for nausea and vomiting.  Neurological: Negative for dizziness, light-headedness and weakness.   All other systems reviewed and are otherwise negative except as noted above.  Physical Exam    VS:  BP 122/74 (BP Location: Left Arm, Patient Position: Sitting, Cuff Size: Normal)   Pulse 74   Ht 5\' 7"  (1.702 m)   Wt 164 lb 2 oz (74.4 kg)   LMP 02/05/2005   SpO2 99%   BMI 25.71 kg/m  , BMI Body mass index is 25.71 kg/m. GEN: Well nourished, well developed, in no acute distress. HEENT: normal. Neck: Supple, no JVD, carotid bruits, or masses. Cardiac: RRR, no murmurs, rubs, or gallops. No clubbing, cyanosis, edema.  Radials/DP/PT 2+ and equal bilaterally.  Respiratory:  Respirations regular and unlabored, clear to auscultation bilaterally. GI: Soft, nontender, nondistended, BS + x 4. MS: No deformity or atrophy. Skin: Warm and dry, no rash. Neuro:  Strength and sensation are intact. Psych: Normal affect.  Assessment & Plan    1. PAF -occurred while in 04/05/2005 in setting of GI illness.  No anticoagulation secondary to CHA2DS2-VASc of 1.  She will continue flecainide as needed.  Recommended she purchase Kardia device.  Discussed triggers of atrial fibrillation including alcohol, caffeine, stress.  No over-the-counter nonprescribed proarrhythmic medications.  We discussed possibility of switching to daily beta-blocker which she politely declines today as her  episodes occur less than once per year. 2. Hypomagnesia -magnesium level 1.8 during recent ED visit in setting of GI illness.  Recommend dietary sources of magnesium.  Disposition: Follow up in 1 year(s) with Dr. [place] or APP  Mariah Milling, NP 08/03/2019, 8:20 AM

## 2019-08-03 NOTE — Patient Instructions (Signed)
Medication Instructions:  No medication changes today.   *If you need a refill on your cardiac medications before your next appointment, please call your pharmacy*   Lab Work: No lab work today. Your lab work at the hospital in [place] showed a mildly low magnesium (1.8 instead of 1.9) recommend dietary sources of magnesium.  If you have labs (blood work) drawn today and your tests are completely normal, you will receive your results only by: Marland Kitchen MyChart Message (if you have MyChart) OR . A paper copy in the mail If you have any lab test that is abnormal or we need to change your treatment, we will call you to review the results.  Testing/Procedures: Your EKG today showed normal sinus rhythm which is a great result.   Follow-Up: At [facility], you and your health needs are our priority.  As part of our continuing mission to provide you with exceptional heart care, we have created designated Provider Care Teams.  These Care Teams include your primary Cardiologist (physician) and Advanced Practice Providers (APPs -  Physician Assistants and Nurse Practitioners) who all work together to provide you with the care you need, when you need it.  We recommend signing up for the patient portal called "MyChart".  Sign up information is provided on this After Visit Summary.  MyChart is used to connect with patients for Virtual Visits (Telemedicine).  Patients are able to view lab/test results, encounter notes, upcoming appointments, etc.  Non-urgent messages can be sent to your provider as well.   To learn more about what you can do with MyChart, go to [email or web address].    Your next appointment:   1 year(s)  The format for your next appointment:   In Person  Provider:    You may see Julien Nordmann, MD or one of the following Advanced Practice Providers on your designated Care Team:    Nicolasa Ducking, NP  Eula Listen, PA-C  Marisue Ivan, PA-C  Other Instructions   Make  sure that your diet includes foods with magnesium. Foods that have a lot of magnesium in them include: ? Green leafy vegetables, such as spinach and broccoli. ? Beans and peas. ? Nuts and seeds, such as almonds and sunflower seeds. ? Whole grains, such as whole grain bread and fortified cereals.  Kardia for monitoring of EKGs at home: You can look into the [facility] device by [employer].This device is purchased by you and it connects to an application you download to your smart phone.  It can detect abnormal heart rhythms and alert you to contact your doctor for further evaluation. The device is approximately $90 and the phone application is free.  The web site is:  https://www.alivecor.com

## 2019-09-01 ENCOUNTER — Other Ambulatory Visit: Payer: BC Managed Care – PPO

## 2019-09-28 ENCOUNTER — Other Ambulatory Visit: Payer: Self-pay

## 2019-09-28 ENCOUNTER — Ambulatory Visit
Admission: RE | Admit: 2019-09-28 | Discharge: 2019-09-28 | Disposition: A | Discharge status: Home / Self Care | Payer: BC Managed Care – PPO | Source: Ambulatory Visit | Attending: Obstetrics and Gynecology | Admitting: Obstetrics and Gynecology

## 2019-09-28 DIAGNOSIS — N6002 Solitary cyst of left breast: Secondary | ICD-10-CM

## 2019-10-26 DIAGNOSIS — G8929 Other chronic pain: Secondary | ICD-10-CM | POA: Insufficient documentation

## 2019-10-26 DIAGNOSIS — K219 Gastro-esophageal reflux disease without esophagitis: Secondary | ICD-10-CM | POA: Insufficient documentation

## 2019-10-26 DIAGNOSIS — J45909 Unspecified asthma, uncomplicated: Secondary | ICD-10-CM | POA: Insufficient documentation

## 2020-08-09 ENCOUNTER — Other Ambulatory Visit: Payer: Self-pay | Admitting: Obstetrics and Gynecology

## 2020-08-09 DIAGNOSIS — Z1231 Encounter for screening mammogram for malignant neoplasm of breast: Secondary | ICD-10-CM

## 2020-11-01 ENCOUNTER — Ambulatory Visit
Admission: RE | Admit: 2020-11-01 | Discharge: 2020-11-01 | Disposition: A | Discharge status: Home / Self Care | Payer: BC Managed Care – PPO | Source: Ambulatory Visit | Attending: Obstetrics and Gynecology | Admitting: Obstetrics and Gynecology

## 2020-11-01 ENCOUNTER — Other Ambulatory Visit: Payer: Self-pay

## 2020-11-01 DIAGNOSIS — Z1231 Encounter for screening mammogram for malignant neoplasm of breast: Secondary | ICD-10-CM | POA: Insufficient documentation

## 2020-12-23 ENCOUNTER — Encounter: Payer: Self-pay | Admitting: Gastroenterology

## 2020-12-25 NOTE — H&P (Signed)
Pre-Procedure H&P   Patient ID: Kayla Castro is a 50 y.o. female.  Gastroenterology Provider: Jaynie Collins, DO  Referring Provider: Tawni Pummel, PA PCP: Feliberto Gottron Ihor Austin, MD  Date: 12/26/2020  HPI Kayla Castro is a 50 y.o. female who presents today for Colonoscopy for surveillance- phx colon polyps and fhx colon polyps.  BM regular, rare diarrhea. No blood. Occasional mobic use. Uses ibuprofen several times a week.  S/p hysterectomy. Cr 0.6 hgb 12.8, mcv 91, plt 338  Last colonoscopy 2012- normal Phx polyps has had colonoscopy in 2004, 2007 as well the latter of which was normal.   Past Medical History:  Diagnosis Date   Allergic rhinitis    Anemia    Angina pectoris (HCC)    Asthma    Dr. Mayo Ao   Asthma, mild intermittent 03/04/2014   Atrial fibrillation (HCC)    Cervical spondylosis without myelopathy    Chronic pelvic pain in female    Colon polyp 02/06/2012   Degeneration of intervertebral disc of lumbar region    Epigastric pain 05/14/2014   GERD (gastroesophageal reflux disease)    History of kidney stones    New onset a-fib (HCC) 02/05/2014   Dr. Mariah Milling   Palpitations    Shortness of breath 03/04/2014    Past Surgical History:  Procedure Laterality Date   ABDOMINAL HYSTERECTOMY  2007   CESAREAN SECTION     x 2   COLONOSCOPY  2014   Dr Bluford Kaufmann   DIAGNOSTIC LAPAROSCOPY     ESOPHAGOGASTRODUODENOSCOPY (EGD) WITH PROPOFOL N/A 10/08/2014   Procedure: ESOPHAGOGASTRODUODENOSCOPY (EGD) WITH PROPOFOL;  Surgeon: Wallace Cullens, MD;  Location: [facility] ENDOSCOPY;  Service: Gastroenterology;  Laterality: N/A;   KIDNEY STONE SURGERY     scar tissue excision x6  2004, 2006   x6   UPPER GI ENDOSCOPY  2014, 2016   Dr. Bluford Kaufmann    Family History Mother colon polyps PGM - CRC No other h/o GI disease or malignancy  Review of Systems  Constitutional:  Negative for activity change, appetite change, fatigue, fever and unexpected weight change.   HENT:  Negative for trouble swallowing and voice change.   Respiratory:  Negative for shortness of breath and wheezing.   Cardiovascular:  Negative for chest pain and palpitations.  Gastrointestinal:  Negative for abdominal distention, abdominal pain, anal bleeding, blood in stool, constipation, diarrhea, nausea, rectal pain and vomiting.  Musculoskeletal:  Negative for arthralgias and myalgias.  Skin:  Negative for color change and pallor.  Neurological:  Negative for dizziness, syncope and weakness.  Psychiatric/Behavioral:  Negative for confusion.   All other systems reviewed and are negative.   Medications No current facility-administered medications on file prior to encounter.   Current Outpatient Medications on File Prior to Encounter  Medication Sig Dispense Refill   albuterol (PROVENTIL HFA;VENTOLIN HFA) 108 (90 BASE) MCG/ACT inhaler Inhale 2 puffs into the lungs every 6 (six) hours as needed.      flecainide (TAMBOCOR) 50 MG tablet Take 1 tablet (50 mg total) by mouth 2 (two) times daily as needed. 60 tablet 0   meloxicam (MOBIC) 15 MG tablet Take 15 mg by mouth daily.     Multiple Vitamin (MULTIVITAMIN) capsule Take 1 capsule by mouth daily. Reported on 05/05/2015     omeprazole (PRILOSEC) 40 MG capsule Take 40 mg daily by mouth.      Potassium 95 MG TABS Take daily by mouth.      Pertinent medications related  to GI and procedure were reviewed by me with the patient prior to the procedure   Current Facility-Administered Medications:    0.9 %  sodium chloride infusion, , Intravenous, Continuous, Jaynie Collins, DO, Last Rate: 20 mL/hr at 12/26/20 1229, Continued from Pre-op at 12/26/20 1229      Allergies  Allergen Reactions   Sulfa Antibiotics Other (See Comments)    Flu like symptoms     Sulfasalazine Other (See Comments)   Allergies were reviewed by me prior to the procedure  Objective    Vitals:   12/26/20 1138  BP: (!) 148/94  Pulse: 82  Resp: 18   Temp: 99.2 F (37.3 C)  TempSrc: Oral  SpO2: 100%  Weight: 72.6 kg  Height: 5\' 7"  (1.702 m)     Physical Exam Vitals reviewed.  Constitutional:      General: She is not in acute distress.    Appearance: Normal appearance. She is not ill-appearing, toxic-appearing or diaphoretic.  HENT:     Head: Normocephalic and atraumatic.     Nose: Nose normal.     Mouth/Throat:     Mouth: Mucous membranes are moist.     Pharynx: Oropharynx is clear.  Eyes:     General: No scleral icterus.    Extraocular Movements: Extraocular movements intact.  Cardiovascular:     Rate and Rhythm: Normal rate and regular rhythm.     Heart sounds: Normal heart sounds. No murmur heard.   No friction rub. No gallop.  Pulmonary:     Effort: Pulmonary effort is normal. No respiratory distress.     Breath sounds: Normal breath sounds. No wheezing, rhonchi or rales.  Abdominal:     General: Bowel sounds are normal. There is no distension.     Palpations: Abdomen is soft.     Tenderness: There is no abdominal tenderness. There is no guarding or rebound.  Musculoskeletal:     Cervical back: Neck supple.     Right lower leg: No edema.     Left lower leg: No edema.  Skin:    General: Skin is warm and dry.     Coloration: Skin is not jaundiced or pale.  Neurological:     General: No focal deficit present.     Mental Status: She is alert and oriented to person, place, and time. Mental status is at baseline.  Psychiatric:        Mood and Affect: Mood normal.        Behavior: Behavior normal.        Thought Content: Thought content normal.        Judgment: Judgment normal.     Assessment:  Kayla Castro is a 50 y.o. female  who presents today for Colonoscopy for personal h/o colon polyps- surveillance.  Plan:  Colonoscopy with possible intervention today  Colonoscopy with possible biopsy, control of bleeding, polypectomy, and interventions as necessary has been discussed with the patient/patient  representative. Informed consent was obtained from the patient/patient representative after explaining the indication, nature, and risks of the procedure including but not limited to death, bleeding, perforation, missed neoplasm/lesions, cardiorespiratory compromise, and reaction to medications. Opportunity for questions was given and appropriate answers were provided. Patient/patient representative has verbalized understanding is amenable to undergoing the procedure.   55, DO  [facility] Gastroenterology  Portions of the record may have been created with voice recognition software. Occasional wrong-word or 'sound-a-like' substitutions may have occurred due to the inherent limitations of  voice recognition software.  Read the chart carefully and recognize, using context, where substitutions may have occurred.

## 2020-12-26 ENCOUNTER — Ambulatory Visit: Payer: BC Managed Care – PPO | Admitting: Anesthesiology

## 2020-12-26 ENCOUNTER — Ambulatory Visit
Admission: RE | Admit: 2020-12-26 | Discharge: 2020-12-26 | Disposition: A | Discharge status: Home / Self Care | Payer: BC Managed Care – PPO | Attending: Gastroenterology | Admitting: Gastroenterology

## 2020-12-26 ENCOUNTER — Encounter
Admission: RE | Disposition: A | Discharge status: Home / Self Care | Payer: Self-pay | Source: Home / Self Care | Attending: Gastroenterology

## 2020-12-26 ENCOUNTER — Other Ambulatory Visit: Payer: Self-pay

## 2020-12-26 ENCOUNTER — Encounter: Payer: Self-pay | Admitting: Gastroenterology

## 2020-12-26 DIAGNOSIS — Z1211 Encounter for screening for malignant neoplasm of colon: Secondary | ICD-10-CM | POA: Insufficient documentation

## 2020-12-26 DIAGNOSIS — Z8371 Family history of colonic polyps: Secondary | ICD-10-CM | POA: Diagnosis not present

## 2020-12-26 DIAGNOSIS — K635 Polyp of colon: Secondary | ICD-10-CM | POA: Diagnosis not present

## 2020-12-26 HISTORY — PX: COLONOSCOPY: SHX5424

## 2020-12-26 HISTORY — DX: Other intervertebral disc degeneration, lumbar region: M51.36

## 2020-12-26 HISTORY — DX: Allergic rhinitis, unspecified: J30.9

## 2020-12-26 HISTORY — DX: Angina pectoris, unspecified: I20.9

## 2020-12-26 HISTORY — DX: Spondylosis without myelopathy or radiculopathy, cervical region: M47.812

## 2020-12-26 HISTORY — DX: Other chronic pain: G89.29

## 2020-12-26 HISTORY — DX: Other intervertebral disc degeneration, lumbar region without mention of lumbar back pain or lower extremity pain: M51.369

## 2020-12-26 SURGERY — COLONOSCOPY
Anesthesia: General

## 2020-12-26 MED ORDER — SODIUM CHLORIDE 0.9 % IV SOLN: INTRAVENOUS | Status: DC

## 2020-12-26 MED ORDER — LIDOCAINE HCL (PF) 2 % IJ SOLN
INTRAMUSCULAR | Status: AC
  Filled 2020-12-26: qty 5

## 2020-12-26 MED ORDER — LIDOCAINE HCL (CARDIAC) PF 100 MG/5ML IV SOSY
PREFILLED_SYRINGE | INTRAVENOUS | Status: DC | PRN
  Administered 2020-12-26: 50 mg via INTRAVENOUS

## 2020-12-26 MED ORDER — PROPOFOL 500 MG/50ML IV EMUL
INTRAVENOUS | Status: AC
  Filled 2020-12-26: qty 50

## 2020-12-26 MED ORDER — PROPOFOL 10 MG/ML IV BOLUS
INTRAVENOUS | Status: DC | PRN
  Administered 2020-12-26: 30 mg via INTRAVENOUS
  Administered 2020-12-26: 70 mg via INTRAVENOUS

## 2020-12-26 MED ORDER — PROPOFOL 500 MG/50ML IV EMUL
INTRAVENOUS | Status: DC | PRN
  Administered 2020-12-26: 175 ug/kg/min via INTRAVENOUS

## 2020-12-26 NOTE — Op Note (Addendum)
[facility] Gastroenterology Patient Name: Kayla Castro Procedure Date: 12/26/2020 12:35 PM MRN: [id number] Account #: [id number] Date of Birth: 08/12/1970 Admit Type: Outpatient Age: 50 Room: [facility] ENDO ROOM 2 Gender: Female Note Status: Supervisor Override Instrument Name: Jasper Riling [id number] Procedure:             Colonoscopy Indications:           High risk colon cancer surveillance: Personal history                         of colonic polyps Providers:             Annamaria Helling DO, DO Referring MD:          no local MD Medicines:             Monitored Anesthesia Care Complications:         No immediate complications. Estimated blood loss:                         Minimal. Procedure:             Pre-Anesthesia Assessment:                        - Prior to the procedure, a History and Physical was                         performed, and patient medications and allergies were                         reviewed. The patient is competent. The risks and                         benefits of the procedure and the sedation options and                         risks were discussed with the patient. All questions                         were answered and informed consent was obtained.                         Patient identification and proposed procedure were                         verified by the physician, the nurse, the anesthetist                         and the technician in the endoscopy suite. Mental                         Status Examination: alert and oriented. Airway                         Examination: normal oropharyngeal airway and neck                         mobility. Respiratory Examination: clear to  auscultation. CV Examination: RRR, no murmurs, no S3                         or S4. Prophylactic Antibiotics: The patient does not                         require prophylactic antibiotics. Prior                          Anticoagulants: The patient has taken no previous                         anticoagulant or antiplatelet agents. ASA Grade                         Assessment: III - A patient with severe systemic                         disease. After reviewing the risks and benefits, the                         patient was deemed in satisfactory condition to                         undergo the procedure. The anesthesia plan was to use                         monitored anesthesia care (MAC). Immediately prior to                         administration of medications, the patient was                         re-assessed for adequacy to receive sedatives. The                         heart rate, respiratory rate, oxygen saturations,                         blood pressure, adequacy of pulmonary ventilation, and                         response to care were monitored throughout the                         procedure. The physical status of the patient was                         re-assessed after the procedure.                        After obtaining informed consent, the colonoscope was                         passed under direct vision. Throughout the procedure,                         the patient's blood pressure, pulse, and oxygen  saturations were monitored continuously. The                         Colonoscope was introduced through the anus and                         advanced to the the terminal ileum, with                         identification of the appendiceal orifice and IC                         valve. The colonoscopy was performed without                         difficulty. The patient tolerated the procedure well.                         The quality of the bowel preparation was evaluated                         using the BBPS [facility] Bowel Preparation Scale) with                         scores of: Right Colon = 3, Transverse Colon = 3 and                         Left Colon = 3  (entire mucosa seen well with no                         residual staining, small fragments of stool or opaque                         liquid). The total BBPS score equals 9. The terminal                         ileum, ileocecal valve, appendiceal orifice, and                         rectum were photographed. Findings:      The perianal and digital rectal examinations were normal. Pertinent       negatives include normal sphincter tone.      Non-bleeding internal hemorrhoids were found during retroflexion. The       hemorrhoids were Grade I (internal hemorrhoids that do not prolapse).       Estimated blood loss: none.      The terminal ileum appeared normal. Estimated blood loss: none.      A 6 to 7 mm polyp was found in the transverse colon. The polyp was       sessile. The polyp was removed with a cold snare. Resection and       retrieval were complete. Estimated blood loss was minimal.      A 2 to 3 mm polyp was found in the sigmoid colon. The polyp was sessile.       The polyp was removed with a cold biopsy forceps. Resection and       retrieval were complete. Estimated blood loss was minimal.  The exam was otherwise without abnormality on direct and retroflexion       views. Impression:            - Non-bleeding internal hemorrhoids.                        - The examined portion of the ileum was normal.                        - One 6 to 7 mm polyp in the transverse colon, removed                         with a cold snare. Resected and retrieved.                        - One 2 to 3 mm polyp in the sigmoid colon, removed                         with a cold biopsy forceps. Resected and retrieved.                        - The examination was otherwise normal on direct and                         retroflexion views. Recommendation:        - Discharge patient to home.                        - Discharge patient to home.                        - Resume previous diet.                         - Continue present medications.                        - Await pathology results.                        - Repeat colonoscopy for surveillance based on                         pathology results.                        - Return to referring physician as previously                         scheduled. Procedure Code(s):     --- Professional ---                        [id number], Colonoscopy, flexible; with removal of                         tumor(s), polyp(s), or other lesion(s) by snare                         technique  01410, 65, Colonoscopy, flexible; with biopsy, single                         or multiple Diagnosis Code(s):     --- Professional ---                        Z83.71, Family history of colonic polyps                        Z86.010, Personal history of colonic polyps                        K64.0, First degree hemorrhoids                        K63.5, Polyp of colon CPT copyright 2019 American Medical Association. All rights reserved. The codes documented in this report are preliminary and upon coder review may  be revised to meet current compliance requirements. Attending Participation:      I personally performed the entire procedure. Volney American, DO Annamaria Helling DO, DO 12/26/2020 1:12:34 PM This report has been signed electronically. Number of Addenda: 0 Note Initiated On: 12/26/2020 12:35 PM Scope Withdrawal Time: 0 hours 12 minutes 36 seconds  Total Procedure Duration: 0 hours 19 minutes 4 seconds  Estimated Blood Loss:  Estimated blood loss: none. Estimated blood loss was                         minimal.      [facility]

## 2020-12-26 NOTE — Transfer of Care (Signed)
Immediate Anesthesia Transfer of Care Note  Patient: Kayla Castro  Procedure(s) Performed: COLONOSCOPY  Patient Location: PACU  Anesthesia Type:General  Level of Consciousness: awake, alert  and drowsy  Airway & Oxygen Therapy: Patient Spontanous Breathing  Post-op Assessment: Report given to RN and Post -op Vital signs reviewed and stable  Post vital signs: Reviewed and stable  Last Vitals:  Vitals Value Taken Time  BP 125/75 12/26/20 1311  Temp 37.2 C 12/26/20 1311  Pulse 80 12/26/20 1311  Resp 13 12/26/20 1313  SpO2 99 % 12/26/20 1311    Last Pain:  Vitals:   12/26/20 1311  TempSrc: Temporal  PainSc: 0-No pain         Complications: No notable events documented.

## 2020-12-26 NOTE — Interval H&P Note (Signed)
History and Physical Interval Note: Preprocedure H&P from 12/26/20  was reviewed and there was no interval change after seeing and examining the patient.  Written consent was obtained from the patient after discussion of risks, benefits, and alternatives. Patient has consented to proceed with Colonoscopy with possible intervention   12/26/2020 12:32 PM  Kayla Castro  has presented today for surgery, with the diagnosis of Personal history of colonic polyps (Z86.010).  The various methods of treatment have been discussed with the patient and family. After consideration of risks, benefits and other options for treatment, the patient has consented to  Procedure(s): COLONOSCOPY (N/A) as a surgical intervention.  The patient's history has been reviewed, patient examined, no change in status, stable for surgery.  I have reviewed the patient's chart and labs.  Questions were answered to the patient's satisfaction.     Jaynie Collins

## 2020-12-26 NOTE — Anesthesia Procedure Notes (Signed)
Date/Time: 12/26/2020 12:45 PM Performed by: Ginger Carne, CRNA Pre-anesthesia Checklist: Patient identified, Emergency Drugs available, Suction available, Patient being monitored and Timeout performed Patient Re-evaluated:Patient Re-evaluated prior to induction Oxygen Delivery Method: Nasal cannula Preoxygenation: Pre-oxygenation with 100% oxygen Induction Type: IV induction

## 2020-12-26 NOTE — Anesthesia Postprocedure Evaluation (Signed)
Anesthesia Post Note  Patient: Kayla Castro  Procedure(s) Performed: COLONOSCOPY  Patient location during evaluation: Phase II Anesthesia Type: General Level of consciousness: awake and alert, awake and oriented Pain management: pain level controlled Vital Signs Assessment: post-procedure vital signs reviewed and stable Respiratory status: spontaneous breathing, nonlabored ventilation and respiratory function stable Cardiovascular status: blood pressure returned to baseline and stable Postop Assessment: no apparent nausea or vomiting Anesthetic complications: no   No notable events documented.   Last Vitals:  Vitals:   12/26/20 1321 12/26/20 1331  BP: 120/67 122/72  Pulse: 80 78  Resp: 17 18  Temp:    SpO2: 100% 100%    Last Pain:  Vitals:   12/26/20 1331  TempSrc:   PainSc: 0-No pain                 Manfred Arch

## 2020-12-26 NOTE — Anesthesia Preprocedure Evaluation (Signed)
Anesthesia Evaluation  Patient identified by MRN, date of birth, ID band Patient awake    Reviewed: Allergy & Precautions, H&P , NPO status , Patient's Chart, lab work & pertinent test results  Airway Mallampati: II  TM Distance: >3 FB Neck ROM: Full    Dental no notable dental hx.    Pulmonary shortness of breath and with exertion, asthma , pneumonia, resolved,    Pulmonary exam normal        Cardiovascular + angina Normal cardiovascular exam+ dysrhythmias (Intermittant A.fib, no rx)      Neuro/Psych negative neurological ROS  negative psych ROS   GI/Hepatic Neg liver ROS, GERD  Medicated,  Endo/Other  negative endocrine ROS  Renal/GU negative Renal ROS  negative genitourinary   Musculoskeletal  (+) Arthritis ,   Abdominal   Peds negative pediatric ROS (+)  Hematology negative hematology ROS (+) anemia ,   Anesthesia Other Findings . Allergic rhinitis  . Anemia  secondary to heavy menstrual cycle  . Asthma without status asthmaticus, unspecified  . Cervical spondylosis without myelopathy  . Chronic pelvic pain in female  S/P hysterectomy and dx lap  . Degeneration of intervertebral disc of lumbar region  . GERD (gastroesophageal reflux disease) 03/23/2014  . History of adenomatous polyp of colon  . History of syncope  . Kidney stone  . Other and unspecified angina pectoris (CMS-HCC)  . Pneumonia  Right lung resolved    Reproductive/Obstetrics negative OB ROS                            Anesthesia Physical Anesthesia Plan  ASA: 3  Anesthesia Plan: General   Post-op Pain Management:    Induction: Intravenous  PONV Risk Score and Plan: 2 and Propofol infusion and TIVA  Airway Management Planned: Natural Airway and Nasal Cannula  Additional Equipment:   Intra-op Plan:   Post-operative Plan:   Informed Consent: I have reviewed the patients History and Physical, chart,  labs and discussed the procedure including the risks, benefits and alternatives for the proposed anesthesia with the patient or authorized representative who has indicated his/her understanding and acceptance.       Plan Discussed with: CRNA, Anesthesiologist and Surgeon  Anesthesia Plan Comments:         Anesthesia Quick Evaluation

## 2020-12-27 ENCOUNTER — Encounter: Payer: Self-pay | Admitting: Gastroenterology

## 2020-12-27 LAB — SURGICAL PATHOLOGY

## 2021-04-25 DIAGNOSIS — M7711 Lateral epicondylitis, right elbow: Secondary | ICD-10-CM | POA: Diagnosis not present

## 2021-06-02 DIAGNOSIS — M7711 Lateral epicondylitis, right elbow: Secondary | ICD-10-CM | POA: Diagnosis not present

## 2021-08-12 NOTE — Progress Notes (Unsigned)
Cardiology Office Note  Date:  08/12/2021   ID:  Kayla Castro, DOB 1970-08-28, MRN [id number]  PCP:  Schermerhorn, Ihor Austin, MD   No chief complaint on file.   HPI:  Kayla Castro  Is a pleasant 51 year old woman with history of  Asthma, anemia,  paroxysmal atrial fibrillation , Symptoms typically start after GI distress who presents for routine follow-up of her arrhythmia.   Last seen by myself in the office 12/2016 Seen by one of our providers 07/2019    Off flecainide, Taking potassium regularly Without potassium she has leg cramping  No severe GI disorder recently Denies any significant palpitations concerning for atrial fibrillation  Lab work reviewed with her in detail, no recent basic metabolic panel since 2017   EKG on today's visit shows normal sinus rhythm with rate 65 bpm, no significant ST or T-wave changes   Other past medical history She presented to [facility] 02/25/14 with abdominal pain, diarrhea, EKG showing atrial fibrillation with heart rate 160 bpm, also with self-reported shortness of breath for several weeks prior to admission, potassium 3.7 on arrival, negative cardiac enzymes, essentially normal TSH, echocardiogram showing normal ejection fraction, normal right ventricular systolic pressure,   converting to normal sinus rhythm with heart rates in the 70s, discharged home    She does report a long history of periodic IBS/colitis symptoms.  History of asthma and takes inhalers periodically   EKG from the hospital showed atrial fibrillation with ventricular rate 164 bpm Echocardiogram reviewed with her 02/25/2014 showing normal ejection fraction, essentially normal study Total cholesterol 185, LDL 111 Prior stress test November 2009 showing no ischemia     PMH:   has a past medical history of Allergic rhinitis, Anemia, Angina pectoris (HCC), Asthma, Asthma, mild intermittent (03/04/2014), Atrial fibrillation (HCC), Cervical spondylosis without myelopathy,  Chronic pelvic pain in female, Colon polyp (02/06/2012), Degeneration of intervertebral disc of lumbar region, Epigastric pain (05/14/2014), GERD (gastroesophageal reflux disease), History of kidney stones, New onset a-fib (HCC) (02/05/2014), Palpitations, and Shortness of breath (03/04/2014).  PSH:    Past Surgical History:  Procedure Laterality Date   ABDOMINAL HYSTERECTOMY  2007   CESAREAN SECTION     x 2   COLONOSCOPY  2014   Dr Bluford Kaufmann   COLONOSCOPY N/A 12/26/2020   Procedure: COLONOSCOPY;  Surgeon: Jaynie Collins, DO;  Location: [facility] ENDOSCOPY;  Service: Gastroenterology;  Laterality: N/A;   DIAGNOSTIC LAPAROSCOPY     ESOPHAGOGASTRODUODENOSCOPY (EGD) WITH PROPOFOL N/A 10/08/2014   Procedure: ESOPHAGOGASTRODUODENOSCOPY (EGD) WITH PROPOFOL;  Surgeon: Wallace Cullens, MD;  Location: [facility] ENDOSCOPY;  Service: Gastroenterology;  Laterality: N/A;   KIDNEY STONE SURGERY     scar tissue excision x6  2004, 2006   x6   UPPER GI ENDOSCOPY  2014, 2016   Dr. Bluford Kaufmann    Current Outpatient Medications  Medication Sig Dispense Refill   albuterol (PROVENTIL HFA;VENTOLIN HFA) 108 (90 BASE) MCG/ACT inhaler Inhale 2 puffs into the lungs every 6 (six) hours as needed.      flecainide (TAMBOCOR) 50 MG tablet Take 1 tablet (50 mg total) by mouth 2 (two) times daily as needed. 60 tablet 0   meloxicam (MOBIC) 15 MG tablet Take 15 mg by mouth daily.     Multiple Vitamin (MULTIVITAMIN) capsule Take 1 capsule by mouth daily. Reported on 05/05/2015     omeprazole (PRILOSEC) 40 MG capsule Take 40 mg daily by mouth.      Potassium 95 MG TABS Take daily by mouth.  No current facility-administered medications for this visit.     Allergies:   Sulfa antibiotics and Sulfasalazine   Social History:  The patient  reports that she has never smoked. She has never used smokeless tobacco. She reports current alcohol use. She reports that she does not use drugs.   Family History:   family history includes CVA in her  mother; Heart attack (age of onset: 40) in her father; Heart disease in her father; Hypertension in her mother.    Review of Systems: Review of Systems  Constitutional: Negative.   Respiratory: Negative.    Cardiovascular: Negative.   Gastrointestinal: Negative.   Musculoskeletal: Negative.   Neurological: Negative.   Psychiatric/Behavioral: Negative.    All other systems reviewed and are negative.    PHYSICAL EXAM: VS:  LMP 02/05/2005  , BMI There is no height or weight on file to calculate BMI. GEN: Well nourished, well developed, in no acute distress  HEENT: normal  Neck: no JVD, carotid bruits, or masses Cardiac: RRR; no murmurs, rubs, or gallops,no edema  Respiratory:  clear to auscultation bilaterally, normal work of breathing GI: soft, nontender, nondistended, + BS MS: no deformity or atrophy  Skin: warm and dry, no rash Neuro:  Strength and sensation are intact Psych: euthymic mood, full affect    Recent Labs: No results found for requested labs within last 365 days.    Lipid Panel Lab Results  Component Value Date   CHOL 185 02/25/2014   HDL 62 (H) 02/25/2014   LDLCALC 111 (H) 02/25/2014   TRIG 60 02/25/2014      Wt Readings from Last 3 Encounters:  12/26/20 160 lb (72.6 kg)  08/03/19 164 lb 2 oz (74.4 kg)  12/11/16 164 lb 8 oz (74.6 kg)       ASSESSMENT AND PLAN:  Paroxysmal atrial fibrillation (HCC) - Plan: EKG 12-Lead No recent symptoms concerning for arrhythmia She takes potassium daily Not on flecainide on a regular basis  Shortness of breath - Plan: EKG 12-Lead Feels well, denies having significant shortness of breath No further workup at this time  Disposition:   F/U 12 months as needed   Total encounter time more than 15 minutes  Greater than 50% was spent in counseling and coordination of care with the patient    No orders of the defined types were placed in this encounter.    Signed, Dossie Arbour, M.D., Ph.D. 08/12/2021   [facility] [place], [place] [phone]

## 2021-08-14 ENCOUNTER — Ambulatory Visit: Payer: BC Managed Care – PPO | Admitting: Cardiovascular Disease

## 2021-08-14 ENCOUNTER — Encounter: Payer: Self-pay | Admitting: Cardiovascular Disease

## 2021-08-14 VITALS — BP 120/76 | HR 85 | Ht 67.0 in | Wt 164.0 lb

## 2021-08-14 DIAGNOSIS — I480 Paroxysmal atrial fibrillation: Secondary | ICD-10-CM

## 2021-08-14 DIAGNOSIS — R0602 Shortness of breath: Secondary | ICD-10-CM

## 2021-08-14 MED ORDER — FLECAINIDE ACETATE 50 MG PO TABS: 50.0000 mg | ORAL_TABLET | Freq: Two times a day (BID) | ORAL | 1 refills | Status: DC | PRN

## 2021-08-14 MED ORDER — METOPROLOL TARTRATE 25 MG PO TABS: 25.0000 mg | ORAL_TABLET | Freq: Two times a day (BID) | ORAL | 1 refills | Status: DC | PRN

## 2021-08-14 NOTE — Patient Instructions (Signed)
Medication Instructions:  Please take metoprolol 25 mg and flecainide 50 mg x1 as needed for breakthrough atrial fibrillation  If you need a refill on your cardiac medications before your next appointment, please call your pharmacy.   Lab work: No new labs needed  Testing/Procedures: No new testing needed  Follow-Up: At [facility], you and your health needs are our priority.  As part of our continuing mission to provide you with exceptional heart care, we have created designated Provider Care Teams.  These Care Teams include your primary Cardiologist (physician) and Advanced Practice Providers (APPs -  Physician Assistants and Nurse Practitioners) who all work together to provide you with the care you need, when you need it.  You will need a follow up appointment in 12 months  Providers on your designated Care Team:   Nicolasa Ducking, NP Eula Listen, PA-C Cadence Fransico Michael, [place]  COVID-19 Vaccine Information can be found at: [email or web address] For questions related to vaccine distribution or appointments, please email [email or web address] or call [phone].

## 2021-08-15 ENCOUNTER — Other Ambulatory Visit: Payer: Self-pay | Admitting: Obstetrics and Gynecology

## 2021-08-15 DIAGNOSIS — Z01419 Encounter for gynecological examination (general) (routine) without abnormal findings: Secondary | ICD-10-CM | POA: Diagnosis not present

## 2021-08-15 DIAGNOSIS — Z1231 Encounter for screening mammogram for malignant neoplasm of breast: Secondary | ICD-10-CM | POA: Diagnosis not present

## 2021-08-15 DIAGNOSIS — Z13220 Encounter for screening for lipoid disorders: Secondary | ICD-10-CM | POA: Diagnosis not present

## 2021-08-16 NOTE — Addendum Note (Signed)
Addended by: Thayer Headings, Novi Calia L on: 08/16/2021 03:46 PM   Modules accepted: Orders

## 2021-11-02 ENCOUNTER — Ambulatory Visit
Admission: RE | Admit: 2021-11-02 | Discharge: 2021-11-02 | Disposition: A | Discharge status: Home / Self Care | Payer: BC Managed Care – PPO | Source: Ambulatory Visit | Attending: Obstetrics and Gynecology | Admitting: Obstetrics and Gynecology

## 2021-11-02 DIAGNOSIS — Z1231 Encounter for screening mammogram for malignant neoplasm of breast: Secondary | ICD-10-CM | POA: Diagnosis not present

## 2022-03-01 DIAGNOSIS — N898 Other specified noninflammatory disorders of vagina: Secondary | ICD-10-CM | POA: Diagnosis not present

## 2022-03-01 DIAGNOSIS — R61 Generalized hyperhidrosis: Secondary | ICD-10-CM | POA: Diagnosis not present

## 2022-03-01 DIAGNOSIS — R232 Flushing: Secondary | ICD-10-CM | POA: Diagnosis not present

## 2022-04-14 DIAGNOSIS — L2389 Allergic contact dermatitis due to other agents: Secondary | ICD-10-CM | POA: Diagnosis not present

## 2022-04-14 DIAGNOSIS — Z6825 Body mass index (BMI) 25.0-25.9, adult: Secondary | ICD-10-CM | POA: Diagnosis not present

## 2022-07-10 DIAGNOSIS — M722 Plantar fascial fibromatosis: Secondary | ICD-10-CM | POA: Diagnosis not present

## 2022-07-31 DIAGNOSIS — M722 Plantar fascial fibromatosis: Secondary | ICD-10-CM | POA: Diagnosis not present

## 2022-08-21 ENCOUNTER — Other Ambulatory Visit: Payer: Self-pay | Admitting: Obstetrics and Gynecology

## 2022-08-21 DIAGNOSIS — Z0189 Encounter for other specified special examinations: Secondary | ICD-10-CM | POA: Diagnosis not present

## 2022-08-21 DIAGNOSIS — Z1211 Encounter for screening for malignant neoplasm of colon: Secondary | ICD-10-CM | POA: Diagnosis not present

## 2022-08-21 DIAGNOSIS — Z13220 Encounter for screening for lipoid disorders: Secondary | ICD-10-CM | POA: Diagnosis not present

## 2022-08-21 DIAGNOSIS — Z1231 Encounter for screening mammogram for malignant neoplasm of breast: Secondary | ICD-10-CM | POA: Diagnosis not present

## 2022-08-21 DIAGNOSIS — Z01419 Encounter for gynecological examination (general) (routine) without abnormal findings: Secondary | ICD-10-CM | POA: Diagnosis not present

## 2022-11-05 ENCOUNTER — Ambulatory Visit
Admission: RE | Admit: 2022-11-05 | Discharge: 2022-11-05 | Disposition: A | Discharge status: Home / Self Care | Payer: BC Managed Care – PPO | Source: Ambulatory Visit | Attending: Obstetrics and Gynecology | Admitting: Obstetrics and Gynecology

## 2022-11-05 DIAGNOSIS — Z1231 Encounter for screening mammogram for malignant neoplasm of breast: Secondary | ICD-10-CM | POA: Diagnosis not present

## 2023-02-24 NOTE — Progress Notes (Unsigned)
Cardiology Clinic Note   Date: 02/26/2023 ID: Kayla Castro, DOB 04/08/1970, MRN [id number]  Primary Cardiologist:  Julien Nordmann, MD  Patient Profile    Kayla Castro is a 53 y.o. female who presents to the clinic today for routine follow up.     Past medical history significant for: PAF. Onset January 2016. Asthma. GERD. Hyperlipidemia.  Lipid panel 08/21/2022: LDL 153, HDL 74, TG 84, total 244.  In summary, patient underwent hospital admission in January 2016 for abdominal pain and diarrhea.  She was found to be in A-fib with RVR heart rate 160 bpm.  She reported several week history of shortness of breath prior to admission.  Echo at that time showed normal LV/RV function.  She converted to normal sinus rhythm.  Previous stress test November 2009 showed no ischemia.  Patient had recurrence of A-fib in June 2021 while in [place], [place].  She was given diltiazem by EMS and converted to NSR.  Episodes of A-fib typically occur during GI distress.     History of Present Illness    Kayla Castro is followed by Dr. Mariah Milling for the above outlined history.  Patient was last seen in the office by Dr. Mariah Milling on 08/14/2021 for routine follow-up.  She was doing well at that time and was provided with Rx for as needed flecainide and metoprolol.  Today, patient is doing well. Patient denies shortness of breath, dyspnea on exertion, lower extremity edema, orthopnea or PND. No chest pain, pressure, or tightness. No palpitations. She has not needed metoprolol or flecainide. She monitors her HR with a Fitbit and noted when she was walking to a football at a college stadium her HR got up to the 130s and she felt a little "woozy" briefly. She states she walked a long way then went up some steps. She does not do regular exercise. She is working on improving her diet to bring down her cholesterol.      ROS: All other systems reviewed and are otherwise negative except as noted in History of Present  Illness.  EKGs/Labs Reviewed    EKG Interpretation Date/Time:  Tuesday February 26 2023 11:27:41 EST Ventricular Rate:  75 PR Interval:  142 QRS Duration:  80 QT Interval:  368 QTC Calculation: 410 R Axis:   7  Text Interpretation: Normal sinus rhythm Low voltage QRS When compared with ECG of 21-Oct-2014 18:57, No significant change was found Confirmed by Carlos Levering [phone]) on 02/26/2023 11:32:53 AM    Risk Assessment/Calculations     CHA2DS2-VASc Score = 1   This indicates a 0.6% annual risk of stroke. The patient's score is based upon: CHF History: 0 HTN History: 0 Diabetes History: 0 Stroke History: 0 Vascular Disease History: 0 Age Score: 0 Gender Score: 1   Physical Exam    VS:  BP 130/80 (BP Location: Left Arm, Patient Position: Sitting, Cuff Size: Normal)   Pulse 75   Ht 5\' 7"  (1.702 m)   Wt 168 lb 8 oz (76.4 kg)   LMP 02/05/2005   SpO2 98%   BMI 26.39 kg/m  , BMI Body mass index is 26.39 kg/m.  GEN: Well nourished, well developed, in no acute distress. Neck: No JVD or carotid bruits. Cardiac:  RRR. No murmurs. No rubs or gallops.   Respiratory:  Respirations regular and unlabored. Clear to auscultation without rales, wheezing or rhonchi. GI: Soft, nontender, nondistended. Extremities: Radials/DP/PT 2+ and equal bilaterally. No clubbing or cyanosis. No edema.  Skin: Warm  and dry, no rash. Neuro: Strength intact.  Assessment & Plan   PAF Onset January 2016.  Episodes typically occur during GI distress. Chads Vasc 1. Not on anticoagulation.  Patient denies palpitations. She has not needed metoprolol or flecainide. She monitors her HR with a Fitbit and noted her HR got up to the 130s and feeling slightly woozy. HR normalized quickly with rest. She does not follow a regular exercise routine. Suspect her wooziness was due to the long walk and climbing multiple stairs. EKG shows NSR, 75 bpm. RRR on exam today.  -Encouraged starting walking program and  progressing as tolerated.  -Instructed to contact the office if she develops increased episodes of palpitations.  -Continue as needed flecainide and metoprolol.  Hyperlipidemia LDL July 2024 753, not at goal. She has been working on improving her diet to bring down her cholesterol so she does not have to go on medication. She will have it rechecked with PCP.  -Discussed Mediterranean diet.  -Followed by PCP.   Disposition: Return in 1 year or sooner as needed.          Signed, Etta Grandchild. Kanen Mottola, DNP, NP-C

## 2023-02-26 ENCOUNTER — Encounter: Payer: Self-pay | Admitting: Student

## 2023-02-26 ENCOUNTER — Ambulatory Visit: Payer: BC Managed Care – PPO | Attending: Student | Admitting: Student

## 2023-02-26 VITALS — BP 130/80 | HR 75 | Ht 67.0 in | Wt 168.5 lb

## 2023-02-26 DIAGNOSIS — R002 Palpitations: Secondary | ICD-10-CM | POA: Diagnosis not present

## 2023-02-26 DIAGNOSIS — I480 Paroxysmal atrial fibrillation: Secondary | ICD-10-CM

## 2023-02-26 DIAGNOSIS — E782 Mixed hyperlipidemia: Secondary | ICD-10-CM | POA: Diagnosis not present

## 2023-02-26 MED ORDER — FLECAINIDE ACETATE 50 MG PO TABS: 50.0000 mg | ORAL_TABLET | Freq: Two times a day (BID) | ORAL | 6 refills | Status: AC | PRN

## 2023-02-26 MED ORDER — METOPROLOL TARTRATE 25 MG PO TABS: 25.0000 mg | ORAL_TABLET | Freq: Two times a day (BID) | ORAL | 3 refills | Status: AC | PRN

## 2023-02-26 NOTE — Patient Instructions (Signed)
Medication Instructions:  Your physician recommends that you continue on your current medications as directed. Please refer to the Current Medication list given to you today.  *If you need a refill on your cardiac medications before your next appointment, please call your pharmacy*   Follow-Up: At [facility], you and your health needs are our priority.  As part of our continuing mission to provide you with exceptional heart care, we have created designated Provider Care Teams.  These Care Teams include your primary Cardiologist (physician) and Advanced Practice Providers (APPs -  Physician Assistants and Nurse Practitioners) who all work together to provide you with the care you need, when you need it.  We recommend signing up for the patient portal called "MyChart".  Sign up information is provided on this After Visit Summary.  MyChart is used to connect with patients for Virtual Visits (Telemedicine).  Patients are able to view lab/test results, encounter notes, upcoming appointments, etc.  Non-urgent messages can be sent to your provider as well.   To learn more about what you can do with MyChart, go to [email or web address].    Your next appointment:   12 month(s)  Provider:   Julien Nordmann, MD    Other Instructions

## 2023-04-29 DIAGNOSIS — E7800 Pure hypercholesterolemia, unspecified: Secondary | ICD-10-CM | POA: Diagnosis not present

## 2023-07-04 DIAGNOSIS — M7671 Peroneal tendinitis, right leg: Secondary | ICD-10-CM | POA: Diagnosis not present

## 2023-07-04 DIAGNOSIS — M7989 Other specified soft tissue disorders: Secondary | ICD-10-CM | POA: Diagnosis not present

## 2023-07-04 DIAGNOSIS — M79671 Pain in right foot: Secondary | ICD-10-CM | POA: Diagnosis not present

## 2023-08-12 DIAGNOSIS — M1990 Unspecified osteoarthritis, unspecified site: Secondary | ICD-10-CM | POA: Diagnosis not present

## 2023-08-12 DIAGNOSIS — Z1159 Encounter for screening for other viral diseases: Secondary | ICD-10-CM | POA: Diagnosis not present

## 2023-08-12 DIAGNOSIS — Z7960 Long term (current) use of unspecified immunomodulators and immunosuppressants: Secondary | ICD-10-CM | POA: Diagnosis not present

## 2023-08-26 ENCOUNTER — Other Ambulatory Visit: Payer: Self-pay | Admitting: Obstetrics and Gynecology

## 2023-08-26 DIAGNOSIS — Z1331 Encounter for screening for depression: Secondary | ICD-10-CM | POA: Diagnosis not present

## 2023-08-26 DIAGNOSIS — Z01411 Encounter for gynecological examination (general) (routine) with abnormal findings: Secondary | ICD-10-CM | POA: Diagnosis not present

## 2023-08-26 DIAGNOSIS — Z1231 Encounter for screening mammogram for malignant neoplasm of breast: Secondary | ICD-10-CM

## 2023-08-26 DIAGNOSIS — Z0189 Encounter for other specified special examinations: Secondary | ICD-10-CM | POA: Diagnosis not present

## 2023-08-26 DIAGNOSIS — E7800 Pure hypercholesterolemia, unspecified: Secondary | ICD-10-CM | POA: Diagnosis not present

## 2023-09-23 DIAGNOSIS — M1990 Unspecified osteoarthritis, unspecified site: Secondary | ICD-10-CM | POA: Diagnosis not present

## 2023-09-23 DIAGNOSIS — Z7960 Long term (current) use of unspecified immunomodulators and immunosuppressants: Secondary | ICD-10-CM | POA: Diagnosis not present

## 2023-09-23 DIAGNOSIS — R768 Other specified abnormal immunological findings in serum: Secondary | ICD-10-CM | POA: Diagnosis not present

## 2023-10-13 ENCOUNTER — Emergency Department

## 2023-10-13 ENCOUNTER — Encounter: Payer: Self-pay | Admitting: Emergency Medicine

## 2023-10-13 ENCOUNTER — Other Ambulatory Visit: Payer: Self-pay

## 2023-10-13 DIAGNOSIS — R0789 Other chest pain: Secondary | ICD-10-CM | POA: Diagnosis not present

## 2023-10-13 NOTE — ED Triage Notes (Signed)
 Patient reports centralized chest pain associated with nausea that started tonight at rest. Pain is described as tightness without radiation. Reports elevated BP reading at home 185/95 - no hx of htn.

## 2023-10-14 ENCOUNTER — Emergency Department
Admission: EM | Admit: 2023-10-14 | Discharge: 2023-10-14 | Disposition: A | Discharge status: Home / Self Care | Attending: Emergency Medicine | Admitting: Emergency Medicine

## 2023-10-14 ENCOUNTER — Emergency Department

## 2023-10-14 DIAGNOSIS — R079 Chest pain, unspecified: Secondary | ICD-10-CM | POA: Diagnosis not present

## 2023-10-14 DIAGNOSIS — R0789 Other chest pain: Secondary | ICD-10-CM

## 2023-10-14 LAB — TROPONIN I (HIGH SENSITIVITY)
Troponin I (High Sensitivity): 6 ng/L (ref <18)
Troponin I (High Sensitivity): 6 ng/L (ref <18)

## 2023-10-14 LAB — CBC
HCT: 37 % (ref 36.0–46.0)
Hemoglobin: 12.3 g/dL (ref 12.0–15.0)
MCH: 30.1 pg (ref 26.0–34.0)
MCHC: 33.2 g/dL (ref 30.0–36.0)
MCV: 90.5 fL (ref 80.0–100.0)
Platelets: 344 K/uL (ref 150–400)
RBC: 4.09 MIL/uL (ref 3.87–5.11)
RDW: 12.3 % (ref 11.5–15.5)
WBC: 7 K/uL (ref 4.0–10.5)
nRBC: 0 % (ref 0.0–0.2)

## 2023-10-14 LAB — BASIC METABOLIC PANEL WITH GFR
Anion gap: 6 (ref 5–15)
BUN: 13 mg/dL (ref 6–20)
CO2: 22 mmol/L (ref 22–32)
Calcium: 8.4 mg/dL — ABNORMAL LOW (ref 8.9–10.3)
Chloride: 106 mmol/L (ref 98–111)
Creatinine, Ser: 0.89 mg/dL (ref 0.44–1.00)
GFR, Estimated: >60 mL/min (ref >60)
Glucose, Bld: 100 mg/dL — ABNORMAL HIGH (ref 70–99)
Potassium: 4 mmol/L (ref 3.5–5.1)
Sodium: 134 mmol/L — ABNORMAL LOW (ref 135–145)

## 2023-10-14 NOTE — ED Provider Notes (Signed)
 [facility] Provider Note    Event Date/Time   First MD Initiated Contact with Patient 10/14/23 0012     (approximate)   History   Chest Pain   HPI Kayla Castro is a 53 y.o. female with no contributory chronic medical history (although she does have history of paroxysmal A-fib) who presents for evaluation of chest tightness.  She said that she started having some centralized chest discomfort associated with nausea that she feels like might have been her acid reflux.  However it got worse and she started to feel very strange all over and was shaking a little bit.  She did not have any sharp pain and there was no radiation.  No shortness of breath, no vomiting.  She and her husband checked her blood pressure at home and it was elevated in the 180s and she has no prior history of hypertension.  Her symptoms have mostly resolved although she says she does not feel quite right currently, but she is not having any of the chest pressure or discomfort.  No numbness or weakness in her extremities, no fever, no abdominal pain.     Physical Exam   Triage Vital Signs: ED Triage Vitals  Encounter Vitals Group     BP 10/13/23 2354 (!) 150/85     Girls Systolic BP Percentile --      Girls Diastolic BP Percentile --      Boys Systolic BP Percentile --      Boys Diastolic BP Percentile --      Pulse Rate 10/13/23 2354 80     Resp 10/13/23 2354 18     Temp 10/13/23 2354 97.8 F (36.6 C)     Temp Source 10/13/23 2354 Oral     SpO2 10/13/23 2354 100 %     Weight 10/13/23 2353 77.1 kg (170 lb)     Height 10/13/23 2353 1.702 m (5' 7)     Head Circumference --      Peak Flow --      Pain Score 10/13/23 2353 2     Pain Loc --      Pain Education --      Exclude from Growth Chart --     Most recent vital signs: Vitals:   10/13/23 2354 10/14/23 0015  BP: (!) 150/85 (!) 144/76  Pulse: 80 71  Resp: 18 16  Temp: 97.8 F (36.6 C)   SpO2: 100% 100%     General: Awake, no distress.  Appears well, conversant, in good spirits despite the ED visit. CV:  Good peripheral perfusion.  Normal heart sounds.  Regular rate and rhythm. Resp:  Normal effort. Speaking easily and comfortably, no accessory muscle usage nor intercostal retractions.  Lungs are clear to auscultation bilaterally. Abd:  No distention.  No tenderness to palpation of the abdomen including in the epigastrium and right upper quadrant with negative Murphy sign.   ED Results / Procedures / Treatments   Labs (all labs ordered are listed, but only abnormal results are displayed) Labs Reviewed  BASIC METABOLIC PANEL WITH GFR - Abnormal; Notable for the following components:      Result Value   Sodium 134 (*)    Glucose, Bld 100 (*)    Calcium 8.4 (*)    All other components within normal limits  CBC  TROPONIN I (HIGH SENSITIVITY)  TROPONIN I (HIGH SENSITIVITY)     EKG  ED ECG REPORT I, Darleene Dome, the attending physician, personally  viewed and interpreted this ECG.  Date: 10/13/2023 EKG Time: 23: 57 Rate: 77 Rhythm: normal sinus rhythm QRS Axis: normal Intervals: normal ST/T Wave abnormalities: normal Narrative Interpretation: no evidence of acute ischemia    RADIOLOGY I independently viewed and interpreted the patient's chest x-ray and I see no evidence of pneumonia or pneumothorax.  I also read the radiologist's report, which confirmed no acute findings.   PROCEDURES:  Critical Care performed: No  Procedures    IMPRESSION / MDM / ASSESSMENT AND PLAN / ED COURSE  I reviewed the triage vital signs and the nursing notes.                              Differential diagnosis includes, but is not limited to, acid reflux or other GI associated chest discomfort, ACS, pneumonia, PE.  Patient's presentation is most consistent with acute presentation with potential threat to life or bodily function.  Labs/studies ordered: EKG, CBC, BMP, high-sensitivity  troponin x 2, two-view chest x-ray  Interventions/Medications given:  Medications - No data to display  (Note:  hospital course my include additional interventions and/or labs/studies not listed above.)   Vital signs are reassuring, initially she was hypertensive but blood pressure has improved substantially down to the 140s.  No indication of uncontrolled hypertension and she will follow-up as an outpatient.  She has an established relationship with Dr. Gollan and can schedule follow-up appointment if she would like to do so with cardiology and declined a referral to cardiology.  She would appreciate a referral to the PCP system which I have done.  At this point she has reassuring lab work with no indication of an emergent medical condition.  She and her husband agree with the plan for repeating the high-sensitivity troponin since the symptoms occurred just prior to her arrival in the emergency department.  If the troponin is within normal limits she feels comfortable with the plan for discharge and outpatient follow-up.  I gave my usual return precautions and anticipation that the repeat troponin will be negative.         FINAL CLINICAL IMPRESSION(S) / ED DIAGNOSES   Final diagnoses:  Chest tightness     Rx / DC Orders   ED Discharge Orders          Ordered    Ambulatory Referral to Primary Care (Establish Care)        10/14/23 0215             Note:  This document was prepared using Dragon voice recognition software and may include unintentional dictation errors.   Gordan Huxley, MD 10/14/23 [phone]

## 2023-10-14 NOTE — Discharge Instructions (Signed)
 Your workup in the Emergency Department today was reassuring.  We did not find any specific abnormalities.  We recommend you drink plenty of fluids, take your regular medications and/or any new ones prescribed today, and follow up with the doctor(s) listed in these documents as recommended.  Return to the Emergency Department if you develop new or worsening symptoms that concern you.

## 2023-12-02 ENCOUNTER — Encounter

## 2023-12-16 NOTE — Progress Notes (Unsigned)
 Cardiology Office Note  Date:  12/17/2023   ID:  Kayla Castro, DOB 12/01/1970, MRN [id number]  PCP:  Kayla Castro, Kayla PARAS, Kayla Castro   Chief Complaint  Patient presents with   12 month follow up     Patient was recently diagnosed with Lupus and is here to discuss her medications for A-Fib. Patient c/o elevated blood pressure, dizziness and racing heart beats.     HPI:  Kayla Castro  Is a 53 year old woman with history of  Asthma, anemia,  paroxysmal atrial fibrillation , Symptoms typically start after GI distress who presents for routine follow-up of her arrhythmia.   Last seen by myself in the office 7/23  No afibsymptoms Not on medication for afib, has not had to take metoprolol  or flecainide   Reports rare episodes of dizziness  getting sinus issues Right leg weakness times Arms felt cold, feels like an adrenaline rush at times  Difficulty tolerating her lupus medications, had to stop  Mom passed away in 2025-08-26Age 13, dementia Recent stress  Labs reviewed: Total chol 220- 240 Wonders if she should treat her cholesterol  Supplements potassium at home, most recent potassium level 4.0  No significant GI distress  EKG personally reviewed by myself on todays visit EKG Interpretation Date/Time:  Tuesday December 17 2023 14:18:14 EST Ventricular Rate:  79 PR Interval:  150 QRS Duration:  82 QT Interval:  372 QTC Calculation: 426 R Axis:   4  Text Interpretation: Normal sinus rhythm Normal ECG When compared with ECG of 13-Oct-2023 23:57, No significant change was found Confirmed by Perla Lye [phone]) on 12/17/2023 3:00:02 PM     Other past medical history She presented to [facility] 02/25/14 with abdominal pain, diarrhea, EKG showing atrial fibrillation with heart rate 160 bpm, also with self-reported shortness of breath for several weeks prior to admission, potassium 3.7 on arrival, negative cardiac enzymes, essentially normal TSH, echocardiogram showing  normal ejection fraction, normal right ventricular systolic pressure,   converting to normal sinus rhythm with heart rates in the 70s, discharged home    She does report a long history of periodic IBS/colitis symptoms.  History of asthma and takes inhalers periodically   EKG from the hospital showed atrial fibrillation with ventricular rate 164 bpm Echocardiogram reviewed with her 02/25/2014 showing normal ejection fraction, essentially normal study Total cholesterol 185, LDL 111 Prior stress test November 2009 showing no ischemia     PMH:   has a past medical history of Allergic rhinitis, Anemia, Angina pectoris, Asthma, Asthma, mild intermittent (03/04/2014), Atrial fibrillation (HCC), Cervical spondylosis without myelopathy, Chronic pelvic pain in female, Colon polyp (02/06/2012), Degeneration of intervertebral disc of lumbar region, Epigastric pain (05/14/2014), GERD (gastroesophageal reflux disease), History of kidney stones, New onset a-fib (HCC) (02/05/2014), Palpitations, and Shortness of breath (03/04/2014).  PSH:    Past Surgical History:  Procedure Laterality Date   ABDOMINAL HYSTERECTOMY  2007   CESAREAN SECTION     x 2   COLONOSCOPY  2014   Dr Ora   COLONOSCOPY N/A 12/26/2020   Procedure: COLONOSCOPY;  Surgeon: Onita Elspeth Sharper, DO;  Location: [facility] ENDOSCOPY;  Service: Gastroenterology;  Laterality: N/A;   DIAGNOSTIC LAPAROSCOPY     ESOPHAGOGASTRODUODENOSCOPY (EGD) WITH PROPOFOL  N/A 10/08/2014   Procedure: ESOPHAGOGASTRODUODENOSCOPY (EGD) WITH PROPOFOL ;  Surgeon: Deward CINDERELLA Ora, Kayla Castro;  Location: [facility] ENDOSCOPY;  Service: Gastroenterology;  Laterality: N/A;   KIDNEY STONE SURGERY     scar tissue excision x6  2004, 2006   x6  UPPER GI ENDOSCOPY  2014, 2016   Dr. Ora    Current Outpatient Medications  Medication Sig Dispense Refill   albuterol (PROVENTIL HFA;VENTOLIN HFA) 108 (90 BASE) MCG/ACT inhaler Inhale 2 puffs into the lungs every 6 (six) hours as needed.       estradiol (ESTRACE) 0.5 MG tablet Take 0.5 mg by mouth daily.     flecainide  (TAMBOCOR ) 50 MG tablet Take 1 tablet (50 mg total) by mouth 2 (two) times daily as needed. 60 tablet 6   metoprolol  tartrate (LOPRESSOR ) 25 MG tablet Take 1 tablet (25 mg total) by mouth 2 (two) times daily as needed (for atrial fibrillation). 60 tablet 3   Multiple Vitamin (MULTIVITAMIN) capsule Take 1 capsule by mouth daily. Reported on 05/05/2015     omeprazole (PRILOSEC) 40 MG capsule Take 40 mg by mouth daily as needed.     Potassium 95 MG TABS Take daily by mouth.     No current facility-administered medications for this visit.     Allergies:   Sulfa antibiotics and Sulfasalazine   Social History:  The patient  reports that she has never smoked. She has never used smokeless tobacco. She reports current alcohol use. She reports that she does not use drugs.   Family History:   family history includes CVA in her mother; Heart attack (age of onset: 44) in her father; Heart disease in her father; Hypertension in her mother.    Review of Systems: Review of Systems  Constitutional: Negative.   Respiratory: Negative.    Cardiovascular: Negative.   Gastrointestinal: Negative.   Musculoskeletal: Negative.   Neurological: Negative.   Psychiatric/Behavioral: Negative.    All other systems reviewed and are negative.  PHYSICAL EXAM: VS:  BP (!) 140/80 (BP Location: Left Arm, Patient Position: Sitting, Cuff Size: Normal)   Pulse 79   Ht 5' 7 (1.702 m)   Wt 168 lb 4 oz (76.3 kg)   LMP 02/05/2005   SpO2 98%   BMI 26.35 kg/m  , BMI Body mass index is 26.35 kg/m. GEN: Well nourished, well developed, in no acute distress  HEENT: normal  Neck: no JVD, carotid bruits, or masses Cardiac: RRR; no murmurs, rubs, or gallops,no edema  Respiratory:  clear to auscultation bilaterally, normal work of breathing GI: soft, nontender, nondistended, + BS MS: no deformity or atrophy  Skin: warm and dry, no rash Neuro:   Strength and sensation are intact Psych: euthymic mood, full affect  Recent Labs: 10/13/2023: BUN 13; Creatinine, Ser 0.89; Hemoglobin 12.3; Platelets 344; Potassium 4.0; Sodium 134    Lipid Panel Lab Results  Component Value Date   CHOL 185 02/25/2014   HDL 62 (H) 02/25/2014   LDLCALC 111 (H) 02/25/2014   TRIG 60 02/25/2014      Wt Readings from Last 3 Encounters:  12/17/23 168 lb 4 oz (76.3 kg)  10/13/23 170 lb (77.1 kg)  02/26/23 168 lb 8 oz (76.4 kg)      ASSESSMENT AND PLAN:  Paroxysmal atrial fibrillation (HCC) -  No arrhythmia She takes potassium regularly  recommend she take flecainide  and metoprolol  as needed for any breakthrough atrial fibrillation No recent episodes  Shortness of breath - Plan: EKG 12-Lead Recommend regular walking program for conditioning  Hyperlipidemia Recommend she start Zetia 10 mg daily Consider CT calcium score   Orders Placed This Encounter  Procedures   EKG 12-Lead     Signed, Velinda Lunger, M.D., Ph.D. 12/17/2023  [facility] [place], [place] [phone]

## 2023-12-17 ENCOUNTER — Encounter: Payer: Self-pay | Admitting: Cardiovascular Disease

## 2023-12-17 ENCOUNTER — Ambulatory Visit: Attending: Cardiovascular Disease | Admitting: Cardiovascular Disease

## 2023-12-17 VITALS — BP 140/80 | HR 79 | Ht 67.0 in | Wt 168.2 lb

## 2023-12-17 DIAGNOSIS — R002 Palpitations: Secondary | ICD-10-CM

## 2023-12-17 DIAGNOSIS — E782 Mixed hyperlipidemia: Secondary | ICD-10-CM | POA: Diagnosis not present

## 2023-12-17 DIAGNOSIS — I480 Paroxysmal atrial fibrillation: Secondary | ICD-10-CM

## 2023-12-17 MED ORDER — EZETIMIBE 10 MG PO TABS: 10.0000 mg | ORAL_TABLET | Freq: Every day | ORAL | 3 refills | Status: AC

## 2023-12-17 NOTE — Patient Instructions (Addendum)
 Medication Instructions:  ?Please start zetia 10 mg daily  ? ?If you need a refill on your cardiac medications before your next appointment, please call your pharmacy.  ? ?Lab work: ?No new labs needed ? ?Testing/Procedures: ?No new testing needed ? ?Follow-Up: ?At [facility], you and your health needs are our priority.  As part of our continuing mission to provide you with exceptional heart care, we have created designated Provider Care Teams.  These Care Teams include your primary Cardiologist (physician) and Advanced Practice Providers (APPs -  Physician Assistants and Nurse Practitioners) who all work together to provide you with the care you need, when you need it. ? ?You will need a follow up appointment in 12 months ? ?Providers on your designated Care Team:   ?Nicolasa Ducking, NP ?Eula Listen, PA-C ?Cadence Fransico Michael, PA-C ? ?COVID-19 Vaccine Information can be found at: [email or web address] For questions related to vaccine distribution or appointments, please email [email or web address] or call [phone].  ? ?

## 2023-12-25 DIAGNOSIS — M1380 Other specified arthritis, unspecified site: Secondary | ICD-10-CM | POA: Diagnosis not present

## 2023-12-25 DIAGNOSIS — Z7960 Long term (current) use of unspecified immunomodulators and immunosuppressants: Secondary | ICD-10-CM | POA: Diagnosis not present

## 2023-12-25 DIAGNOSIS — R7689 Other specified abnormal immunological findings in serum: Secondary | ICD-10-CM | POA: Diagnosis not present

## 2023-12-31 ENCOUNTER — Ambulatory Visit
Admission: RE | Admit: 2023-12-31 | Discharge: 2023-12-31 | Disposition: A | Discharge status: Home / Self Care | Source: Ambulatory Visit | Attending: Obstetrics and Gynecology | Admitting: Obstetrics and Gynecology

## 2023-12-31 DIAGNOSIS — Z1231 Encounter for screening mammogram for malignant neoplasm of breast: Secondary | ICD-10-CM | POA: Insufficient documentation
